# Patient Record
Sex: Male | Born: 1990 | Race: Black or African American | Hispanic: No | Marital: Single | Smoking: Current every day smoker
Health system: Southern US, Community
[De-identification: ages and names within clinical notes are randomized; demographics above are authoritative.]

## PROBLEM LIST (undated history)

## (undated) DIAGNOSIS — I10 Essential (primary) hypertension: Secondary | ICD-10-CM

---

## 2020-02-20 ENCOUNTER — Inpatient Hospital Stay (HOSPITAL_COMMUNITY)
Admission: EM | Admit: 2020-02-20 | Discharge: 2020-02-25 | DRG: 558 | Disposition: A | Discharge status: Home / Self Care | Attending: Internal Medicine | Admitting: Internal Medicine

## 2020-02-20 ENCOUNTER — Other Ambulatory Visit: Payer: Self-pay

## 2020-02-20 ENCOUNTER — Emergency Department (HOSPITAL_COMMUNITY)

## 2020-02-20 ENCOUNTER — Encounter (HOSPITAL_COMMUNITY): Payer: Self-pay | Admitting: Emergency Medicine

## 2020-02-20 DIAGNOSIS — Z79899 Other long term (current) drug therapy: Secondary | ICD-10-CM

## 2020-02-20 DIAGNOSIS — E669 Obesity, unspecified: Secondary | ICD-10-CM | POA: Diagnosis present

## 2020-02-20 DIAGNOSIS — R569 Unspecified convulsions: Secondary | ICD-10-CM | POA: Diagnosis present

## 2020-02-20 DIAGNOSIS — Z87820 Personal history of traumatic brain injury: Secondary | ICD-10-CM | POA: Diagnosis not present

## 2020-02-20 DIAGNOSIS — Z20822 Contact with and (suspected) exposure to covid-19: Secondary | ICD-10-CM | POA: Diagnosis not present

## 2020-02-20 DIAGNOSIS — E860 Dehydration: Secondary | ICD-10-CM | POA: Diagnosis not present

## 2020-02-20 DIAGNOSIS — R55 Syncope and collapse: Secondary | ICD-10-CM | POA: Diagnosis present

## 2020-02-20 DIAGNOSIS — M62838 Other muscle spasm: Secondary | ICD-10-CM | POA: Diagnosis present

## 2020-02-20 DIAGNOSIS — R21 Rash and other nonspecific skin eruption: Secondary | ICD-10-CM | POA: Diagnosis present

## 2020-02-20 DIAGNOSIS — M6282 Rhabdomyolysis: Principal | ICD-10-CM | POA: Diagnosis present

## 2020-02-20 DIAGNOSIS — N179 Acute kidney failure, unspecified: Secondary | ICD-10-CM | POA: Diagnosis not present

## 2020-02-20 DIAGNOSIS — F17200 Nicotine dependence, unspecified, uncomplicated: Secondary | ICD-10-CM | POA: Diagnosis not present

## 2020-02-20 DIAGNOSIS — I10 Essential (primary) hypertension: Secondary | ICD-10-CM | POA: Diagnosis present

## 2020-02-20 DIAGNOSIS — Z832 Family history of diseases of the blood and blood-forming organs and certain disorders involving the immune mechanism: Secondary | ICD-10-CM

## 2020-02-20 DIAGNOSIS — Z833 Family history of diabetes mellitus: Secondary | ICD-10-CM

## 2020-02-20 DIAGNOSIS — Z6834 Body mass index (BMI) 34.0-34.9, adult: Secondary | ICD-10-CM | POA: Diagnosis not present

## 2020-02-20 DIAGNOSIS — E871 Hypo-osmolality and hyponatremia: Secondary | ICD-10-CM | POA: Diagnosis present

## 2020-02-20 HISTORY — DX: Essential (primary) hypertension: I10

## 2020-02-20 LAB — CBC WITH DIFFERENTIAL/PLATELET
Abs Immature Granulocytes: 0.07 10*3/uL (ref 0.00–0.07)
Basophils Absolute: 0.1 10*3/uL (ref 0.0–0.1)
Basophils Relative: 1 %
Eosinophils Absolute: 0.1 10*3/uL (ref 0.0–0.5)
Eosinophils Relative: 0 %
HCT: 48.7 % (ref 39.0–52.0)
Hemoglobin: 16.7 g/dL (ref 13.0–17.0)
Immature Granulocytes: 1 %
Lymphocytes Relative: 13 %
Lymphs Abs: 1.7 10*3/uL (ref 0.7–4.0)
MCH: 29.5 pg (ref 26.0–34.0)
MCHC: 34.3 g/dL (ref 30.0–36.0)
MCV: 85.9 fL (ref 80.0–100.0)
Monocytes Absolute: 1 10*3/uL (ref 0.1–1.0)
Monocytes Relative: 8 %
Neutro Abs: 9.8 10*3/uL — ABNORMAL HIGH (ref 1.7–7.7)
Neutrophils Relative %: 77 %
Platelets: 387 10*3/uL (ref 150–400)
RBC: 5.67 MIL/uL (ref 4.22–5.81)
RDW: 11.9 % (ref 11.5–15.5)
WBC: 12.7 10*3/uL — ABNORMAL HIGH (ref 4.0–10.5)
nRBC: 0 % (ref 0.0–0.2)

## 2020-02-20 LAB — URINALYSIS, ROUTINE W REFLEX MICROSCOPIC
Bacteria, UA: NONE SEEN
Bilirubin Urine: NEGATIVE
Glucose, UA: NEGATIVE mg/dL
Ketones, ur: NEGATIVE mg/dL
Leukocytes,Ua: NEGATIVE
Nitrite: NEGATIVE
Protein, ur: NEGATIVE mg/dL
Specific Gravity, Urine: 1.008 (ref 1.005–1.030)
pH: 6 (ref 5.0–8.0)

## 2020-02-20 LAB — COMPREHENSIVE METABOLIC PANEL
ALT: 51 U/L — ABNORMAL HIGH (ref 0–44)
AST: 86 U/L — ABNORMAL HIGH (ref 15–41)
Albumin: 4.6 g/dL (ref 3.5–5.0)
Alkaline Phosphatase: 57 U/L (ref 38–126)
Anion gap: 11 (ref 5–15)
BUN: 18 mg/dL (ref 6–20)
CO2: 22 mmol/L (ref 22–32)
Calcium: 9.8 mg/dL (ref 8.9–10.3)
Chloride: 99 mmol/L (ref 98–111)
Creatinine, Ser: 1.69 mg/dL — ABNORMAL HIGH (ref 0.61–1.24)
GFR calc Af Amer: >60 mL/min (ref >60)
GFR calc non Af Amer: 54 mL/min — ABNORMAL LOW (ref >60)
Glucose, Bld: 112 mg/dL — ABNORMAL HIGH (ref 70–99)
Potassium: 4.9 mmol/L (ref 3.5–5.1)
Sodium: 132 mmol/L — ABNORMAL LOW (ref 135–145)
Total Bilirubin: 0.9 mg/dL (ref 0.3–1.2)
Total Protein: 8.2 g/dL — ABNORMAL HIGH (ref 6.5–8.1)

## 2020-02-20 LAB — MAGNESIUM: Magnesium: 2 mg/dL (ref 1.7–2.4)

## 2020-02-20 LAB — RAPID URINE DRUG SCREEN, HOSP PERFORMED
Amphetamines: NOT DETECTED
Barbiturates: NOT DETECTED
Benzodiazepines: NOT DETECTED
Cocaine: NOT DETECTED
Opiates: NOT DETECTED
Tetrahydrocannabinol: POSITIVE — AB

## 2020-02-20 LAB — PHOSPHORUS: Phosphorus: 2.9 mg/dL (ref 2.5–4.6)

## 2020-02-20 LAB — CK: Total CK: 5083 U/L — ABNORMAL HIGH (ref 49–397)

## 2020-02-20 MED ORDER — LACTATED RINGERS IV BOLUS
1000.0000 mL | Freq: Once | INTRAVENOUS | Status: AC
  Administered 2020-02-20: 1000 mL via INTRAVENOUS

## 2020-02-20 MED ORDER — LACTATED RINGERS IV SOLN: INTRAVENOUS | Status: DC
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
## 2020-02-21 ENCOUNTER — Encounter (HOSPITAL_COMMUNITY): Payer: Self-pay | Admitting: Internal Medicine

## 2020-02-21 ENCOUNTER — Observation Stay (HOSPITAL_COMMUNITY)

## 2020-02-21 ENCOUNTER — Inpatient Hospital Stay (HOSPITAL_COMMUNITY)

## 2020-02-21 DIAGNOSIS — M6282 Rhabdomyolysis: Principal | ICD-10-CM

## 2020-02-21 DIAGNOSIS — M62838 Other muscle spasm: Secondary | ICD-10-CM | POA: Diagnosis present

## 2020-02-21 DIAGNOSIS — N179 Acute kidney failure, unspecified: Secondary | ICD-10-CM | POA: Diagnosis present

## 2020-02-21 DIAGNOSIS — Z832 Family history of diseases of the blood and blood-forming organs and certain disorders involving the immune mechanism: Secondary | ICD-10-CM | POA: Diagnosis not present

## 2020-02-21 DIAGNOSIS — E871 Hypo-osmolality and hyponatremia: Secondary | ICD-10-CM

## 2020-02-21 DIAGNOSIS — R569 Unspecified convulsions: Secondary | ICD-10-CM | POA: Diagnosis present

## 2020-02-21 DIAGNOSIS — Z6834 Body mass index (BMI) 34.0-34.9, adult: Secondary | ICD-10-CM | POA: Diagnosis not present

## 2020-02-21 DIAGNOSIS — R55 Syncope and collapse: Secondary | ICD-10-CM

## 2020-02-21 DIAGNOSIS — I10 Essential (primary) hypertension: Secondary | ICD-10-CM

## 2020-02-21 DIAGNOSIS — E669 Obesity, unspecified: Secondary | ICD-10-CM | POA: Diagnosis present

## 2020-02-21 DIAGNOSIS — Z79899 Other long term (current) drug therapy: Secondary | ICD-10-CM | POA: Diagnosis not present

## 2020-02-21 DIAGNOSIS — Z87820 Personal history of traumatic brain injury: Secondary | ICD-10-CM | POA: Diagnosis not present

## 2020-02-21 DIAGNOSIS — E860 Dehydration: Secondary | ICD-10-CM | POA: Diagnosis present

## 2020-02-21 DIAGNOSIS — Z20822 Contact with and (suspected) exposure to covid-19: Secondary | ICD-10-CM | POA: Diagnosis present

## 2020-02-21 DIAGNOSIS — F17200 Nicotine dependence, unspecified, uncomplicated: Secondary | ICD-10-CM | POA: Diagnosis present

## 2020-02-21 DIAGNOSIS — Z833 Family history of diabetes mellitus: Secondary | ICD-10-CM | POA: Diagnosis not present

## 2020-02-21 DIAGNOSIS — R21 Rash and other nonspecific skin eruption: Secondary | ICD-10-CM | POA: Diagnosis present

## 2020-02-21 LAB — CBC WITH DIFFERENTIAL/PLATELET
Abs Immature Granulocytes: 0.03 10*3/uL (ref 0.00–0.07)
Basophils Absolute: 0 10*3/uL (ref 0.0–0.1)
Basophils Relative: 1 %
Eosinophils Absolute: 0.2 10*3/uL (ref 0.0–0.5)
Eosinophils Relative: 2 %
HCT: 41.7 % (ref 39.0–52.0)
Hemoglobin: 14 g/dL (ref 13.0–17.0)
Immature Granulocytes: 0 %
Lymphocytes Relative: 22 %
Lymphs Abs: 1.9 10*3/uL (ref 0.7–4.0)
MCH: 29.4 pg (ref 26.0–34.0)
MCHC: 33.6 g/dL (ref 30.0–36.0)
MCV: 87.4 fL (ref 80.0–100.0)
Monocytes Absolute: 0.9 10*3/uL (ref 0.1–1.0)
Monocytes Relative: 10 %
Neutro Abs: 5.7 10*3/uL (ref 1.7–7.7)
Neutrophils Relative %: 65 %
Platelets: 299 10*3/uL (ref 150–400)
RBC: 4.77 MIL/uL (ref 4.22–5.81)
RDW: 12 % (ref 11.5–15.5)
WBC: 8.7 10*3/uL (ref 4.0–10.5)
nRBC: 0 % (ref 0.0–0.2)

## 2020-02-21 LAB — COMPREHENSIVE METABOLIC PANEL
ALT: 45 U/L — ABNORMAL HIGH (ref 0–44)
AST: 84 U/L — ABNORMAL HIGH (ref 15–41)
Albumin: 3.5 g/dL (ref 3.5–5.0)
Alkaline Phosphatase: 44 U/L (ref 38–126)
Anion gap: 8 (ref 5–15)
BUN: 15 mg/dL (ref 6–20)
CO2: 24 mmol/L (ref 22–32)
Calcium: 8.8 mg/dL — ABNORMAL LOW (ref 8.9–10.3)
Chloride: 105 mmol/L (ref 98–111)
Creatinine, Ser: 1.21 mg/dL (ref 0.61–1.24)
GFR calc Af Amer: >60 mL/min (ref >60)
GFR calc non Af Amer: >60 mL/min (ref >60)
Glucose, Bld: 119 mg/dL — ABNORMAL HIGH (ref 70–99)
Potassium: 4 mmol/L (ref 3.5–5.1)
Sodium: 137 mmol/L (ref 135–145)
Total Bilirubin: 0.6 mg/dL (ref 0.3–1.2)
Total Protein: 6.4 g/dL — ABNORMAL LOW (ref 6.5–8.1)

## 2020-02-21 LAB — HEMOGLOBIN A1C
Hgb A1c MFr Bld: 5.8 % — ABNORMAL HIGH (ref 4.8–5.6)
Mean Plasma Glucose: 119.76 mg/dL

## 2020-02-21 LAB — TSH: TSH: 2.143 u[IU]/mL (ref 0.350–4.500)

## 2020-02-21 LAB — MAGNESIUM: Magnesium: 2 mg/dL (ref 1.7–2.4)

## 2020-02-21 LAB — TROPONIN I (HIGH SENSITIVITY)
Troponin I (High Sensitivity): 47 ng/L — ABNORMAL HIGH (ref <18)
Troponin I (High Sensitivity): 36 ng/L — ABNORMAL HIGH (ref <18)

## 2020-02-21 LAB — SARS CORONAVIRUS 2 BY RT PCR (DIASORIN): SARS Coronavirus 2: NEGATIVE

## 2020-02-21 LAB — CK: Total CK: 5225 U/L — ABNORMAL HIGH (ref 49–397)

## 2020-02-21 MED ORDER — SODIUM CHLORIDE 0.9 % IV SOLN: INTRAVENOUS | Status: DC

## 2020-02-21 MED ORDER — LORAZEPAM 2 MG/ML IJ SOLN
1.0000 mg | Freq: Once | INTRAMUSCULAR | Status: AC
  Administered 2020-02-21: 1 mg via INTRAVENOUS
  Filled 2020-02-21: qty 1

## 2020-02-21 MED ORDER — AMLODIPINE BESYLATE 10 MG PO TABS
10.0000 mg | ORAL_TABLET | Freq: Every day | ORAL | Status: DC
  Administered 2020-02-21 – 2020-02-25 (×5): 10 mg via ORAL
  Filled 2020-02-21 (×2): qty 1
  Filled 2020-02-21: qty 2
  Filled 2020-02-21 (×2): qty 1

## 2020-02-21 MED ORDER — ENOXAPARIN SODIUM 40 MG/0.4ML ~~LOC~~ SOLN
40.0000 mg | SUBCUTANEOUS | Status: DC
  Administered 2020-02-21 – 2020-02-25 (×5): 40 mg via SUBCUTANEOUS
  Filled 2020-02-21 (×7): qty 0.4

## 2020-02-21 MED ORDER — ONDANSETRON HCL 4 MG PO TABS
4.0000 mg | ORAL_TABLET | Freq: Four times a day (QID) | ORAL | Status: DC | PRN
  Administered 2020-02-22: 4 mg via ORAL
  Filled 2020-02-21: qty 1

## 2020-02-21 MED ORDER — MONTELUKAST SODIUM 10 MG PO TABS
10.0000 mg | ORAL_TABLET | Freq: Every day | ORAL | Status: DC
  Administered 2020-02-21 – 2020-02-24 (×4): 10 mg via ORAL
  Filled 2020-02-21 (×5): qty 1

## 2020-02-21 MED ORDER — ONDANSETRON HCL 4 MG/2ML IJ SOLN: 4.0000 mg | Freq: Four times a day (QID) | INTRAMUSCULAR | Status: DC | PRN

## 2020-02-21 NOTE — Procedures (Signed)
Patient Name: Casey Lyons  MRN: [id number]  Epilepsy Attending: Charlsie Quest  Referring Physician/Provider: Dr Midge Minium Date: 02/21/2020 Duration: 25.40 mins  Patient history: 29 year old male with an episode of altered mental status, right arm stiffness, eye blinking that sounds most consistent with a complex partial seizure. EEG to evaluate for seizure.   Level of alertness: Awake, asleep  AEDs during EEG study: None  Technical aspects: This EEG study was done with scalp electrodes positioned according to the 10-20 International system of electrode placement. Electrical activity was acquired at a sampling rate of 500Hz  and reviewed with a high frequency filter of 70Hz  and a low frequency filter of 1Hz . EEG data were recorded continuously and digitally stored.   Description: The posterior dominant rhythm consists of 10.5 Hz activity of moderate voltage (25-35 uV) seen predominantly in posterior head regions, symmetric and reactive to eye opening and eye closing. Sleep was characterized by vertex waves, sleep spindles (12 to 14 Hz), maximal frontocentral region.  Physiology photic driving was not seen during photic stimulation.  Hyperventilation was not performed.     IMPRESSION: This study is within normal limits. No seizures or epileptiform discharges were seen throughout the recording.  Danel Studzinski 
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
## 2020-02-21 NOTE — Assessment & Plan Note (Addendum)
-  Etiology at this time includes possible dehydration/physical overexertion versus due to possible seizure (EEG and MRI brain normal) -Per neurology, patient started on Keppra.  Continue on 500 mg twice daily.  Outpatient follow-up with neuromuscular -Continue on telemetry monitoring

## 2020-02-21 NOTE — Progress Notes (Signed)
PROGRESS NOTE    Casey Lyons   HQI:[id number]  DOB: 10/24/1990  DOA: 02/20/2020     0  PCP[facility]  CC: loss of consciousness/muscle cramping/spasms  Hospital Course: [name] is a 29 yo AA male with PMH HTN, muscle spasms who was admitted to the hospital after an episode of severe muscle spasm/cramping.  He described an episode where his hands and legs had "locked up" and were almost shaking.  There was concern for possible seizure-like activity although there was no described tongue biting or urinary incontinence.  His limbs were noted to be rigid at time of EMS arrival. 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  He endorses staying hydrated during the day, notably drinking up to a gallon of water but also tries to intake electrolyte solutions as well.  He endorses to similar prior episodes in life as well.  He does take baclofen as needed for his muscle spasms. On work-up he was found to have rhabdomyolysis with CK approximately 5000.  He also underwent CT head and MRI brain to rule out stroke and neurology was consulted for EEG.   Interval History:  Admitted overnight after questionable seizure episode versus severe muscle spasms/cramping.  He is still sore in his arms and legs as well as stomach this morning.  Wife is bedside and also updated with questions answered.  He endorses being hydrated at work mostly drinking water but does do electrolyte solutions intermittently.  He states that he typically has multiple episodes daily of significant sweating which he says is normal for him.  Old records reviewed in assessment of this patient  ROS: Pertinent items noted in HPI and remainder of comprehensive ROS otherwise negative.  Assessment & Plan: Rhabdomyolysis - CK ~ 5000 on admission; differential for etiology includes dehydration leading to rhabdomyolysis versus seizure precipitating rhabdo -Continue fluids and trend CK.  Patient is  appropriately sore as expected -Agree that indeterminate troponin likely related to rhabdo.  Continue trending.  Patient has no chest pain or shortness of breath.  Some vague ST changes appreciated on EKG.  Repeat after rhabdo improves further -Monitor renal function.  Creatinine was 1.69 on admission -Repeat BMP daily  Essential hypertension -Continue amlodipine  Syncope -Etiology at this time includes possible dehydration/physical overexertion versus due to possible seizure -Continue on telemetry monitoring -Follow-up EEG -Follow-up MRI brain.  Initial CT head unremarkable  Hyponatremia -Mild and not severe enough to contribute to a seizure however does pose concern for possible undetected hyponatremia at other times.  Etiology would either be due to excessive water intake versus volume depletion from dehydration/sweating -Monitor sodium while on fluids   Antimicrobials: N/A  DVT prophylaxis: Lovenox Code Status: Full Family Communication: Wife bedside Disposition Plan:  Status is: Observation  The patient will require care spanning > 2 midnights and should be moved to inpatient because: Persistent severe electrolyte disturbances, IV treatments appropriate due to intensity of illness or inability to take PO and Inpatient level of care appropriate due to severity of illness  Dispo: The patient is from: Home              Anticipated d/c is to: Home              Anticipated d/c date is: 2 days              Patient currently is not medically stable to d/c.   Objective: Blood pressure (!) 115/88, pulse 77, temperature 98.5  F (36.9 C), temperature source Oral, resp. rate 22, height 5\' 10"  (1.778 m), weight (!) 102.1 kg, SpO2 97 %.  Examination: General appearance: alert, cooperative and no distress Head: Normocephalic, without obvious abnormality, atraumatic Eyes: EOMI Lungs: clear to auscultation bilaterally Heart: regular rate and rhythm and S1, S2 normal Abdomen: Tender  to palpation nonspecifically, bowel sounds present, nondistended Extremities: No edema Skin: mobility and turgor normal Neurologic: Grossly normal   Consultants:   Neurology  Procedures:   EEG, 02/21/2020  Data Reviewed: I have personally reviewed following labs and imaging studies Results for orders placed or performed during the hospital encounter of 02/20/20 (from the past 24 hour(s))  CBC WITH DIFFERENTIAL     Status: Abnormal   Collection Time: 02/20/20  9:11 PM  Result Value Ref Range   WBC 12.7 (H) 4.0 - 10.5 K/uL   RBC 5.67 4.22 - 5.81 MIL/uL   Hemoglobin 16.7 13.0 - 17.0 g/dL   HCT 02/22/20 39 - 52 %   MCV 85.9 80.0 - 100.0 fL   MCH 29.5 26.0 - 34.0 pg   MCHC 34.3 30.0 - 36.0 g/dL   RDW 28.7 68.1 - 15.7 %   Platelets 387 150 - 400 K/uL   nRBC 0.0 0.0 - 0.2 %   Neutrophils Relative % 77 %   Neutro Abs 9.8 (H) 1.7 - 7.7 K/uL   Lymphocytes Relative 13 %   Lymphs Abs 1.7 0.7 - 4.0 K/uL   Monocytes Relative 8 %   Monocytes Absolute 1.0 0 - 1 K/uL   Eosinophils Relative 0 %   Eosinophils Absolute 0.1 0 - 0 K/uL   Basophils Relative 1 %   Basophils Absolute 0.1 0 - 0 K/uL   Immature Granulocytes 1 %   Abs Immature Granulocytes 0.07 0.00 - 0.07 K/uL  Comprehensive metabolic panel     Status: Abnormal   Collection Time: 02/20/20  9:11 PM  Result Value Ref Range   Sodium 132 (L) 135 - 145 mmol/L   Potassium 4.9 3.5 - 5.1 mmol/L   Chloride 99 98 - 111 mmol/L   CO2 22 22 - 32 mmol/L   Glucose, Bld 112 (H) 70 - 99 mg/dL   BUN 18 6 - 20 mg/dL   Creatinine, Ser 02/22/20 (H) 0.61 - 1.24 mg/dL   Calcium 9.8 8.9 - 0.35 mg/dL   Total Protein 8.2 (H) 6.5 - 8.1 g/dL   Albumin 4.6 3.5 - 5.0 g/dL   AST 86 (H) 15 - 41 U/L   ALT 51 (H) 0 - 44 U/L   Alkaline Phosphatase 57 38 - 126 U/L   Total Bilirubin 0.9 0.3 - 1.2 mg/dL   GFR calc non Af Amer 54 (L) >60 mL/min   GFR calc Af Amer >60 >60 mL/min   Anion gap 11 5 - 15  CK     Status: Abnormal   Collection Time: 02/20/20  9:11 PM    Result Value Ref Range   Total CK 5,083 (H) 49.0 - 397.0 U/L  Magnesium     Status: None   Collection Time: 02/20/20  9:32 PM  Result Value Ref Range   Magnesium 2.0 1.7 - 2.4 mg/dL  Phosphorus     Status: None   Collection Time: 02/20/20  9:32 PM  Result Value Ref Range   Phosphorus 2.9 2.5 - 4.6 mg/dL  Rapid urine drug screen (hospital performed)     Status: Abnormal   Collection Time: 02/20/20  9:43 PM  Result Value  Ref Range   Opiates NONE DETECTED NONE DETECTED   Cocaine NONE DETECTED NONE DETECTED   Benzodiazepines NONE DETECTED NONE DETECTED   Amphetamines NONE DETECTED NONE DETECTED   Tetrahydrocannabinol POSITIVE (A) NONE DETECTED   Barbiturates NONE DETECTED NONE DETECTED  Urinalysis, Routine w reflex microscopic     Status: Abnormal   Collection Time: 02/20/20  9:43 PM  Result Value Ref Range   Color, Urine YELLOW YELLOW   APPearance HAZY (A) CLEAR   Specific Gravity, Urine 1.008 1.005 - 1.030   pH 6.0 5.0 - 8.0   Glucose, UA NEGATIVE NEGATIVE mg/dL   Hgb urine dipstick SMALL (A) NEGATIVE   Bilirubin Urine NEGATIVE NEGATIVE   Ketones, ur NEGATIVE NEGATIVE mg/dL   Protein, ur NEGATIVE NEGATIVE mg/dL   Nitrite NEGATIVE NEGATIVE   Leukocytes,Ua NEGATIVE NEGATIVE   RBC / HPF 0-5 0 - 5 RBC/hpf   WBC, UA 0-5 0 - 5 WBC/hpf   Bacteria, UA NONE SEEN NONE SEEN   Squamous Epithelial / LPF 0-5 0 - 5   Mucus PRESENT    Hyaline Casts, UA PRESENT   SARS Coronavirus 2 by RT PCR     Status: None   Collection Time: 02/21/20 12:41 AM  Result Value Ref Range   SARS Coronavirus 2 NEGATIVE NEGATIVE  TSH     Status: None   Collection Time: 02/21/20  2:07 AM  Result Value Ref Range   TSH 2.143 0.350 - 4.500 uIU/mL  Troponin I (High Sensitivity)     Status: Abnormal   Collection Time: 02/21/20  2:07 AM  Result Value Ref Range   Troponin I (High Sensitivity) 47 (H) <18 ng/L  CBC with Differential/Platelet     Status: None   Collection Time: 02/21/20  8:34 AM  Result Value Ref  Range   WBC 8.7 4.0 - 10.5 K/uL   RBC 4.77 4.22 - 5.81 MIL/uL   Hemoglobin 14.0 13.0 - 17.0 g/dL   HCT 35.3 39 - 52 %   MCV 87.4 80.0 - 100.0 fL   MCH 29.4 26.0 - 34.0 pg   MCHC 33.6 30.0 - 36.0 g/dL   RDW 61.4 43.1 - 54.0 %   Platelets 299 150 - 400 K/uL   nRBC 0.0 0.0 - 0.2 %   Neutrophils Relative % 65 %   Neutro Abs 5.7 1.7 - 7.7 K/uL   Lymphocytes Relative 22 %   Lymphs Abs 1.9 0.7 - 4.0 K/uL   Monocytes Relative 10 %   Monocytes Absolute 0.9 0 - 1 K/uL   Eosinophils Relative 2 %   Eosinophils Absolute 0.2 0 - 0 K/uL   Basophils Relative 1 %   Basophils Absolute 0.0 0 - 0 K/uL   Immature Granulocytes 0 %   Abs Immature Granulocytes 0.03 0.00 - 0.07 K/uL    Recent Results (from the past 240 hour(s))  SARS Coronavirus 2 by RT PCR     Status: None   Collection Time: 02/21/20 12:41 AM  Result Value Ref Range Status   SARS Coronavirus 2 NEGATIVE NEGATIVE Final    Comment: (NOTE) SARS-CoV-2 target nucleic acids are NOT DETECTED.  The SARS-CoV-2 RNA is generally detectable in upper and lower respiratory specimens during the acute phase of infection. The lowest concentration of SARS-CoV-2 viral copies this assay can detect is 250 copies / mL. A negative result does not preclude SARS-CoV-2 infection and should not be used as the sole basis for treatment or other patient management decisions.  A negative  result may occur with improper specimen collection / handling, submission of specimen other than nasopharyngeal swab, presence of viral mutation(s) within the areas targeted by this assay, and inadequate number of viral copies (<250 copies / mL). A negative result must be combined with clinical observations, patient history, and epidemiological information.  Fact Sheet for Patients:   BoilerBrush.com.cyhttps://www.fda.gov/media/136312/download  Fact Sheet for Healthcare Providers: https://pope.com/https://www.fda.gov/media/136313/download  This test is not yet approved or  cleared by the [place] FDA  and has been authorized for detection and/or diagnosis of SARS-CoV-2 by FDA under an Emergency Use Authorization (EUA).  This EUA will remain in effect (meaning this test can be used) for the duration of the COVID-19 declaration under Section 564(b)(1) of the Act, 21 U.S.C. section 360bbb-3(b)(1), unless the authorization is terminated or revoked sooner.  Performed at [facility], [address] [place], [place] [address]      Radiology Studies: CT HEAD WO CONTRAST  Result Date: 02/20/2020 CLINICAL DATA:  Seizure EXAM: CT HEAD WITHOUT CONTRAST TECHNIQUE: Contiguous axial images were obtained from the base of the skull through the vertex without intravenous contrast. COMPARISON:  None. FINDINGS: Brain: No evidence of acute infarction, hemorrhage, hydrocephalus, extra-axial collection or mass lesion/mass effect. Vascular: No hyperdense vessel or unexpected calcification. Skull: Normal. Negative for fracture or focal lesion. Sinuses/Orbits: Mucous retention cysts in the maxillary sinuses Other: None IMPRESSION: Negative non contrasted CT appearance of the brain. Electronically Signed   By: Jasmine PangKim  Fujinaga M.D.   On: 02/20/2020 22:22   MR BRAIN WO CONTRAST  Result Date: 02/21/2020 CLINICAL DATA:  Mental status change, unknown cause. Additional history provided: Loss of consciousness. EXAM: MRI HEAD WITHOUT CONTRAST TECHNIQUE: Multiplanar, multiecho pulse sequences of the brain and surrounding structures were obtained without intravenous contrast. COMPARISON:  Head CT 02/20/2020 FINDINGS: Brain: The examination is intermittently motion degraded, limiting evaluation. Most notably, there is mild-to-moderate motion degradation of the sagittal T1 weighted sequence, mild-to-moderate motion degradation of the axial T2 weighted sequence, moderate motion degradation of the axial SWI sequence. There are a few scattered punctate foci of T2/FLAIR hyperintensity within the cerebral white matter which are  nonspecific. There is no acute infarct. No evidence of intracranial mass. No chronic intracranial blood products. No extra-axial fluid collection. No midline shift. Vascular: Expected proximal arterial flow voids. Skull and upper cervical spine: No focal marrow lesion. Sinuses/Orbits: Visualized orbits show no acute finding. Mild ethmoid sinus mucosal thickening. Small bilateral maxillary sinus mucous retention cyst. No significant mastoid effusion. IMPRESSION: Motion degraded examination as described. No evidence of acute infarct or other acute intracranial abnormality. There are a few scattered nonspecific punctate foci of T2/FLAIR hyperintensity within the cerebral white matter. Mild ethmoid sinus mucosal thickening. Small bilateral maxillary sinus mucous retention cysts. Electronically Signed   By: Jackey LogeKyle  Golden DO   On: 02/21/2020 08:36   MR BRAIN WO CONTRAST  Final Result    CT HEAD WO CONTRAST  Final Result       Scheduled Meds: . amLODipine  10 mg Oral Daily  . enoxaparin (LOVENOX) injection  40 mg Subcutaneous Q24H  . montelukast  10 mg Oral QHS   PRN Meds: ondansetron **OR** ondansetron (ZOFRAN) IV Continuous Infusions: . sodium chloride 125 mL/hr at 02/21/20 0438  . lactated ringers Stopped (02/21/20 0222)      LOS: 0 days  Time spent: Greater than 50% of the 35 minute visit was spent in counseling/coordination of care for the patient as laid out in the A&P.  Lewie Chamber, MD [facility] 02/21/2020, 9:07 AM   Contact via secure chat.  To contact the attending provider between 7A-7P or the covering provider during after hours 7P-7A, please log into the web site [email or web address] and access using universal [place] password for that web site. If you do not have the password, please call the hospital operator.

## 2020-02-21 NOTE — Assessment & Plan Note (Addendum)
-  Mild and not severe enough to contribute to a seizure however does pose concern for possible undetected hyponatremia at other times.  Etiology would either be due to excessive water intake versus volume depletion from dehydration/sweating -Monitor sodium while on fluids.  Has responded well to fluids and he was likely volume depleted/dehydrated despite his water intake that he states

## 2020-02-21 NOTE — ED Notes (Signed)
EEG at bedside.

## 2020-02-21 NOTE — ED Notes (Signed)
Pt ambulatory to and from restroom with steady gait 

## 2020-02-21 NOTE — Assessment & Plan Note (Addendum)
-   CK ~ 5000 on admission; differential for etiology includes dehydration leading to rhabdomyolysis versus seizure precipitating rhabdo.  Seizure has now been ruled out as a feasible cause.  After further work-up and history taking, there may also be other underlying contributing etiologies.  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  He has described episodes in the past of vague skin manifestations/rashes of unknown origin or trigger.  He also endorses muscle soreness that is out of proportion to the activity being done along with his physical exertion while outside.  There is also a family history of autoimmune disease -CK was initially unresponsive to fluids which also raised suspicion of underlying myositis.  LDH is also elevated as well as LFTs which are also commonly seen with myositis. Workup started; follow up anti-Ro, anti-La, and anti-Smith antibodies.  Will also check ANA.  He will need outpatient referral to rheumatology thru the [place] most likely.  -Continue trending CK, responding better now with gradual downtrend; likely can d/c home on Tues or Wed -Troponin has normalized with fluids
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
## 2020-02-21 NOTE — Progress Notes (Signed)
EEG complete - results pending 

## 2020-02-21 NOTE — Assessment & Plan Note (Signed)
-   Continue amlodipine ?

## 2020-02-21 NOTE — ED Notes (Signed)
Breakfast Ordered 

## 2020-02-21 NOTE — ED Notes (Signed)
Patient transported to MRI 

## 2020-02-21 NOTE — Hospital Course (Addendum)
Casey Lyons is a 29 yo AA male with PMH HTN, muscle spasms who was admitted to the hospital after an episode of severe muscle spasm/cramping.  He described an episode where his hands and legs had "locked up" and were almost shaking.  There was concern for possible seizure-like activity although there was no described tongue biting or urinary incontinence.  His limbs were noted to be rigid at time of EMS arrival. 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  He endorses staying hydrated during the day, notably drinking up to a gallon of water but also tries to intake electrolyte solutions as well.  He endorses to similar prior episodes in life as well.  He does take baclofen as needed for his muscle spasms. On work-up he was found to have rhabdomyolysis with CK approximately 5000.  He also underwent CT head and MRI brain to rule out stroke and neurology was consulted for EEG. EEG was negative for sz activity and MRI brain was negative for obvious acute abnormalities.  There were a few nonspecific scattered punctate foci in the cerebral right matter of unclear significance but likely not clinically relevant. He was started on Keppra per neurology due to high suspicion that patient had a seizure prior to presentation.  Patient was also instructed to refrain from driving per neurology.  He will follow-up outpatient with neuromuscular (Dr. Allena Katz) and referral was placed during hospitalization from neurology.  With further work-up and obtaining further history from patient, his CK did not significantly respond to IV fluids.  He was also found to have elevated liver enzymes, elevated LDH, and he also started endorsing history of a vague rash on his skin that he experienced intermittently.  He also then endorsed a further family history of lupus.  He will certainly need referral to rheumatology at follow-up after discharge.  His CK overall continued to downtrend with monitoring.  His muscle  soreness persisted but improved daily. He will need follow up with neurology at discharge regarding starting on Keppra during hospitalization and referral to rheumatology for consideration of further myositis workup. Labs pending at time of discharge.

## 2020-02-22 DIAGNOSIS — N179 Acute kidney failure, unspecified: Secondary | ICD-10-CM

## 2020-02-22 LAB — CBC WITH DIFFERENTIAL/PLATELET
Abs Immature Granulocytes: 0.01 10*3/uL (ref 0.00–0.07)
Basophils Absolute: 0 10*3/uL (ref 0.0–0.1)
Basophils Relative: 1 %
Eosinophils Absolute: 0.3 10*3/uL (ref 0.0–0.5)
Eosinophils Relative: 6 %
HCT: 41.8 % (ref 39.0–52.0)
Hemoglobin: 13.6 g/dL (ref 13.0–17.0)
Immature Granulocytes: 0 %
Lymphocytes Relative: 38 %
Lymphs Abs: 2 10*3/uL (ref 0.7–4.0)
MCH: 28.9 pg (ref 26.0–34.0)
MCHC: 32.5 g/dL (ref 30.0–36.0)
MCV: 88.7 fL (ref 80.0–100.0)
Monocytes Absolute: 0.6 10*3/uL (ref 0.1–1.0)
Monocytes Relative: 11 %
Neutro Abs: 2.3 10*3/uL (ref 1.7–7.7)
Neutrophils Relative %: 44 %
Platelets: 272 10*3/uL (ref 150–400)
RBC: 4.71 MIL/uL (ref 4.22–5.81)
RDW: 12.1 % (ref 11.5–15.5)
WBC: 5.2 10*3/uL (ref 4.0–10.5)
nRBC: 0 % (ref 0.0–0.2)

## 2020-02-22 LAB — BASIC METABOLIC PANEL
Anion gap: 8 (ref 5–15)
BUN: 9 mg/dL (ref 6–20)
CO2: 26 mmol/L (ref 22–32)
Calcium: 8.7 mg/dL — ABNORMAL LOW (ref 8.9–10.3)
Chloride: 106 mmol/L (ref 98–111)
Creatinine, Ser: 1.05 mg/dL (ref 0.61–1.24)
GFR calc Af Amer: >60 mL/min (ref >60)
GFR calc non Af Amer: >60 mL/min (ref >60)
Glucose, Bld: 99 mg/dL (ref 70–99)
Potassium: 4.2 mmol/L (ref 3.5–5.1)
Sodium: 140 mmol/L (ref 135–145)

## 2020-02-22 LAB — CK: Total CK: 5268 U/L — ABNORMAL HIGH (ref 49–397)

## 2020-02-22 LAB — LACTATE DEHYDROGENASE: LDH: 265 U/L — ABNORMAL HIGH (ref 98–192)

## 2020-02-22 LAB — TROPONIN I (HIGH SENSITIVITY): Troponin I (High Sensitivity): 15 ng/L (ref <18)

## 2020-02-22 LAB — MAGNESIUM: Magnesium: 2.2 mg/dL (ref 1.7–2.4)

## 2020-02-22 MED ORDER — HYDROXYZINE HCL 25 MG PO TABS
25.0000 mg | ORAL_TABLET | Freq: Three times a day (TID) | ORAL | Status: DC | PRN
  Administered 2020-02-23 – 2020-02-25 (×2): 25 mg via ORAL
  Filled 2020-02-22 (×2): qty 1

## 2020-02-22 MED ORDER — DIPHENHYDRAMINE-ZINC ACETATE 2-0.1 % EX CREA
TOPICAL_CREAM | Freq: Three times a day (TID) | CUTANEOUS | Status: DC | PRN
  Filled 2020-02-22 (×2): qty 28

## 2020-02-22 NOTE — Assessment & Plan Note (Signed)
-  Appears that this was related to probable dehydration.  Less likely to be a nephropathy from rhabdo given CK not high enough as typically expected as well as normalization overnight with fluids -Continue fluids and monitoring BMP

## 2020-02-22 NOTE — Progress Notes (Signed)
PROGRESS NOTE    Casey Lyons   MAU:[id number]  DOB: 21-Mar-1991  DOA: 02/20/2020     1  PCP: [facility]  CC: loss of consciousness/muscle cramping/spasms  Hospital Course: Mr. Casey Lyons is a 29 yo AA male with PMH HTN, muscle spasms who was admitted to the hospital after an episode of severe muscle spasm/cramping.  He described an episode where his hands and legs had "locked up" and were almost shaking.  There was concern for possible seizure-like activity although there was no described tongue biting or urinary incontinence.  His limbs were noted to be rigid at time of EMS arrival. 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  He endorses staying hydrated during the day, notably drinking up to a gallon of water but also tries to intake electrolyte solutions as well.  He endorses to similar prior episodes in life as well.  He does take baclofen as needed for his muscle spasms. On work-up he was found to have rhabdomyolysis with CK approximately 5000.  He also underwent CT head and MRI brain to rule out stroke and neurology was consulted for EEG.   Interval History:  No acute events overnight.  Still complaining of soreness in his muscles mainly his thighs and arms which he says is possibly a little worse than yesterday.  Still weak in his legs from the soreness as well.  He is also endorsing a very small raised rash on his forehead that is also itchy.  He states that he typically get these and then they resolve and sometimes leave a little scar.  Old records reviewed in assessment of this patient  ROS: Pertinent items noted in HPI and remainder of comprehensive ROS otherwise negative.  Assessment & Plan: Rhabdomyolysis - CK ~ 5000 on admission; differential for etiology includes dehydration leading to rhabdomyolysis versus seizure precipitating rhabdo.  Seizure has now been ruled out as a feasible cause.  After further work-up and history taking, there may also  be other underlying contributing etiologies.  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  He has described episodes in the past of vague skin manifestations/rashes of unknown origin or trigger.  He also endorses muscle soreness that is out of proportion to the activity being done along with his physical exertion while outside.  There is also a family history of autoimmune disease -CK has been unresponsive to fluids which also raises suspicion of underlying myositis.  LDH is also elevated as well as LFTs which are also commonly seen with myositis.  At this time will initiate some further work-up with anti-Ro, anti-La, and anti-Smith antibodies.  Will also check ANA.  He will need outpatient referral to rheumatology -Continue trending CK, if refractory to treatment, will likely need to pursue further inpatient work-up as he would not be considered safe/stable for discharging without further input -Troponin has normalized with fluids  Essential hypertension -Continue amlodipine  Syncope -Etiology at this time includes possible dehydration/physical overexertion versus due to possible seizure (EEG and MRI brain normal/negative so low likelihood) -Continue on telemetry monitoring -After work-up, it appears that his syncope was related to his dehydration and possibly a vagal component  Hyponatremia -Mild and not severe enough to contribute to a seizure however does pose concern for possible undetected hyponatremia at other times.  Etiology would either be due to excessive water intake versus volume depletion from dehydration/sweating -Monitor sodium while on fluids.  Has responded well to fluids  and he was likely volume depleted/dehydrated despite his water intake that he states  AKI (acute kidney injury) (HCC) -Appears that this was related to probable dehydration.  Less likely to be a nephropathy from rhabdo given CK not high enough as typically expected  as well as normalization overnight with fluids -Continue fluids and monitoring BMP   Antimicrobials: N/A  DVT prophylaxis: Lovenox Code Status: Full Family Communication: Wife bedside Disposition Plan:  Status is: Observation  The patient will require care spanning > 2 midnights and should be moved to inpatient because: Persistent severe electrolyte disturbances, IV treatments appropriate due to intensity of illness or inability to take PO and Inpatient level of care appropriate due to severity of illness  Dispo: The patient is from: Home              Anticipated d/c is to: Home              Anticipated d/c date is: 2 days              Patient currently is not medically stable to d/c.   Objective: Blood pressure 126/76, pulse 76, temperature 98.2 F (36.8 C), temperature source Oral, resp. rate 18, height 5\' 10"  (1.778 m), weight (!) 108 kg, SpO2 98 %.  Examination: General appearance: alert, cooperative and no distress Head: Normocephalic, without obvious abnormality, atraumatic Eyes: EOMI Lungs: clear to auscultation bilaterally Heart: regular rate and rhythm and S1, S2 normal Abdomen: Tender to palpation nonspecifically, bowel sounds present, nondistended Extremities: No edema.  Tenderness to palpation of quadricep muscles Skin: mobility and turgor normal Neurologic: Grossly normal   Consultants:   Neurology  Procedures:   EEG, 02/21/2020.  No seizure activity appreciated  Data Reviewed: I have personally reviewed following labs and imaging studies Results for orders placed or performed during the hospital encounter of 02/20/20 (from the past 24 hour(s))  Basic metabolic panel     Status: Abnormal   Collection Time: 02/22/20  7:02 AM  Result Value Ref Range   Sodium 140 135 - 145 mmol/L   Potassium 4.2 3.5 - 5.1 mmol/L   Chloride 106 98 - 111 mmol/L   CO2 26 22 - 32 mmol/L   Glucose, Bld 99 70 - 99 mg/dL   BUN 9 6 - 20 mg/dL   Creatinine, Ser 1.611.05 0.61 - 1.24  mg/dL   Calcium 8.7 (L) 8.9 - 10.3 mg/dL   GFR calc non Af Amer >60 >60 mL/min   GFR calc Af Amer >60 >60 mL/min   Anion gap 8 5 - 15  CBC with Differential/Platelet     Status: None   Collection Time: 02/22/20  7:02 AM  Result Value Ref Range   WBC 5.2 4.0 - 10.5 K/uL   RBC 4.71 4.22 - 5.81 MIL/uL   Hemoglobin 13.6 13.0 - 17.0 g/dL   HCT 09.641.8 39 - 52 %   MCV 88.7 80.0 - 100.0 fL   MCH 28.9 26.0 - 34.0 pg   MCHC 32.5 30.0 - 36.0 g/dL   RDW 04.512.1 40.911.5 - 81.115.5 %   Platelets 272 150 - 400 K/uL   nRBC 0.0 0.0 - 0.2 %   Neutrophils Relative % 44 %   Neutro Abs 2.3 1.7 - 7.7 K/uL   Lymphocytes Relative 38 %   Lymphs Abs 2.0 0.7 - 4.0 K/uL   Monocytes Relative 11 %   Monocytes Absolute 0.6 0 - 1 K/uL   Eosinophils Relative 6 %   Eosinophils Absolute  0.3 0 - 0 K/uL   Basophils Relative 1 %   Basophils Absolute 0.0 0 - 0 K/uL   Immature Granulocytes 0 %   Abs Immature Granulocytes 0.01 0.00 - 0.07 K/uL  Magnesium     Status: None   Collection Time: 02/22/20  7:02 AM  Result Value Ref Range   Magnesium 2.2 1.7 - 2.4 mg/dL  CK     Status: Abnormal   Collection Time: 02/22/20  7:02 AM  Result Value Ref Range   Total CK 5,268 (H) 49.0 - 397.0 U/L  Troponin I (High Sensitivity)     Status: None   Collection Time: 02/22/20  7:02 AM  Result Value Ref Range   Troponin I (High Sensitivity) 15 <18 ng/L  Lactate dehydrogenase     Status: Abnormal   Collection Time: 02/22/20  2:17 PM  Result Value Ref Range   LDH 265 (H) 98 - 192 U/L    Recent Results (from the past 240 hour(s))  SARS Coronavirus 2 by RT PCR     Status: None   Collection Time: 02/21/20 12:41 AM  Result Value Ref Range Status   SARS Coronavirus 2 NEGATIVE NEGATIVE Final    Comment: (NOTE) SARS-CoV-2 target nucleic acids are NOT DETECTED.  The SARS-CoV-2 RNA is generally detectable in upper and lower respiratory specimens during the acute phase of infection. The lowest concentration of SARS-CoV-2 viral copies this  assay can detect is 250 copies / mL. A negative result does not preclude SARS-CoV-2 infection and should not be used as the sole basis for treatment or other patient management decisions.  A negative result may occur with improper specimen collection / handling, submission of specimen other than nasopharyngeal swab, presence of viral mutation(s) within the areas targeted by this assay, and inadequate number of viral copies (<250 copies / mL). A negative result must be combined with clinical observations, patient history, and epidemiological information.  Fact Sheet for Patients:   BoilerBrush.com.cy  Fact Sheet for Healthcare Providers: https://pope.com/  This test is not yet approved or  cleared by the [place] FDA and has been authorized for detection and/or diagnosis of SARS-CoV-2 by FDA under an Emergency Use Authorization (EUA).  This EUA will remain in effect (meaning this test can be used) for the duration of the COVID-19 declaration under Section 564(b)(1) of the Act, 21 U.S.C. section 360bbb-3(b)(1), unless the authorization is terminated or revoked sooner.  Performed at [facility], [address] [place], [place] [address]      Radiology Studies: EEG  Result Date: 02/21/2020 Charlsie Quest, MD     02/21/2020 11:55 AM Patient Name: Casey Lyons MRN: [id number] Epilepsy Attending: Charlsie Quest Referring Physician/Provider: Dr Midge Minium Date: 02/21/2020 Duration: 25.40 mins Patient history: 29 year old male with an episode of altered mental status, right arm stiffness, eye blinking that sounds most consistent with a complex partial seizure. EEG to evaluate for seizure. Level of alertness: Awake, asleep AEDs during EEG study: None Technical aspects: This EEG study was done with scalp electrodes positioned according to the 10-20 International system of electrode placement. Electrical activity was acquired at a  sampling rate of 500Hz  and reviewed with a high frequency filter of 70Hz  and a low frequency filter of 1Hz . EEG data were recorded continuously and digitally stored. Description: The posterior dominant rhythm consists of 10.5 Hz activity of moderate voltage (25-35 uV) seen predominantly in posterior head regions, symmetric and reactive to eye opening and eye closing. Sleep  was characterized by vertex waves, sleep spindles (12 to 14 Hz), maximal frontocentral region.  Physiology photic driving was not seen during photic stimulation.  Hyperventilation was not performed.   IMPRESSION: This study is within normal limits. No seizures or epileptiform discharges were seen throughout the recording. Charlsie Quest   CT HEAD WO CONTRAST  Result Date: 02/20/2020 CLINICAL DATA:  Seizure EXAM: CT HEAD WITHOUT CONTRAST TECHNIQUE: Contiguous axial images were obtained from the base of the skull through the vertex without intravenous contrast. COMPARISON:  None. FINDINGS: Brain: No evidence of acute infarction, hemorrhage, hydrocephalus, extra-axial collection or mass lesion/mass effect. Vascular: No hyperdense vessel or unexpected calcification. Skull: Normal. Negative for fracture or focal lesion. Sinuses/Orbits: Mucous retention cysts in the maxillary sinuses Other: None IMPRESSION: Negative non contrasted CT appearance of the brain. Electronically Signed   By: Jasmine Pang M.D.   On: 02/20/2020 22:22   MR BRAIN WO CONTRAST  Result Date: 02/21/2020 CLINICAL DATA:  Mental status change, unknown cause. Additional history provided: Loss of consciousness. EXAM: MRI HEAD WITHOUT CONTRAST TECHNIQUE: Multiplanar, multiecho pulse sequences of the brain and surrounding structures were obtained without intravenous contrast. COMPARISON:  Head CT 02/20/2020 FINDINGS: Brain: The examination is intermittently motion degraded, limiting evaluation. Most notably, there is mild-to-moderate motion degradation of the sagittal T1 weighted  sequence, mild-to-moderate motion degradation of the axial T2 weighted sequence, moderate motion degradation of the axial SWI sequence. There are a few scattered punctate foci of T2/FLAIR hyperintensity within the cerebral white matter which are nonspecific. There is no acute infarct. No evidence of intracranial mass. No chronic intracranial blood products. No extra-axial fluid collection. No midline shift. Vascular: Expected proximal arterial flow voids. Skull and upper cervical spine: No focal marrow lesion. Sinuses/Orbits: Visualized orbits show no acute finding. Mild ethmoid sinus mucosal thickening. Small bilateral maxillary sinus mucous retention cyst. No significant mastoid effusion. IMPRESSION: Motion degraded examination as described. No evidence of acute infarct or other acute intracranial abnormality. There are a few scattered nonspecific punctate foci of T2/FLAIR hyperintensity within the cerebral white matter. Mild ethmoid sinus mucosal thickening. Small bilateral maxillary sinus mucous retention cysts. Electronically Signed   By: Jackey Loge DO   On: 02/21/2020 08:36   MR BRAIN WO CONTRAST  Final Result    CT HEAD WO CONTRAST  Final Result       Scheduled Meds:  amLODipine  10 mg Oral Daily   enoxaparin (LOVENOX) injection  40 mg Subcutaneous Q24H   montelukast  10 mg Oral QHS   PRN Meds: diphenhydrAMINE-zinc acetate, hydrOXYzine, ondansetron **OR** ondansetron (ZOFRAN) IV Continuous Infusions:  sodium chloride 150 mL/hr at 02/22/20 0410   lactated ringers Stopped (02/21/20 0222)      LOS: 1 day  Time spent: Greater than 50% of the 35 minute visit was spent in counseling/coordination of care for the patient as laid out in the A&P.   Lewie Chamber, MD [facility] 02/22/2020, 4:38 PM   Contact via secure chat.  To contact the attending provider between 7A-7P or the covering provider during after hours 7P-7A, please log into the web site [email or web address] and  access using universal [place] password for that web site. If you do not have the password, please call the hospital operator.

## 2020-02-22 NOTE — Plan of Care (Signed)

## 2020-02-22 NOTE — Progress Notes (Addendum)
HOPI: 29 year old male with an episode of altered mental status, right arm stiffness, eye blinking that sounds most consistent with a complex partial seizure.  Subjective: No more seizure like events. The semiology of the event seems concerning for seizure  Objective:  Temp:  [97.7 F (36.5 C)-99.3 F (37.4 C)] 98.2 F (36.8 C) (07/24 1154) Pulse Rate:  [58-76] 76 (07/24 1154) Resp:  [16-20] 18 (07/24 1153) BP: (105-130)/(65-91) 126/76 (07/24 1154) SpO2:  [96 %-100 %] 98 % (07/24 1154) Weight:  [259 kg] 108 kg (07/24 0333) Body mass index is 34.16 kg/m.  General: Laying comfortably in bed; in no acute distress. HENT: Normal oropharynx and mucosa. Normal external appearance of ears and nose Neck: Supple, no pain or tenderness. CV: RRR without murmur. No peripheral edema. Pulmonary: Symmetric chest rise. Normal respiratoy effort. Abdomen: positive bowel sounds, soft, non-tender. Normal liver and spleen size. Ext: No cyanosis, edema, or deformity. Skin: No rash. Normal palpation of skin. Musculoskeletal: Normal digits and nails by inspection. No Clubbing.  Neurologic Examination  MENTAL STATUS: AAOx3, memory intact, fund of knowledge appropriate LANG/SPEECH: Naming and repetition intact, fluent, follows 3-step commands CRANIAL NERVES: II: Pupils equal and reactive, no RAPD, no VF deficits, normal fundus III, IV, VI: EOM intact, no gaze preference or deviation, no nystagmus. V: normal sensation in V1, V2, and V3 segments bilaterally VII: no asymmetry, no nasolabial fold flattening VIII: normal hearing to speech IX, X: normal palatal elevation, no uvular deviation XI: 5/5 head turn and 5/5 shoulder shrug bilaterally XII: midline tongue protrusion MOTOR: 4+/4+ muscle power in BL shoulder abductors,  54+/4+ BL elbow flexors/extensors 5/5 hand grip.  4+/4+ in BL hipflexors/extensors 5/5 in BL knee flexors/extensors, ankle dorsiflexors and planter flexors.   REFLEXES: 2/4  throughout, bilateral flexor planter response, no Hoffman's, no clonus SENSORY: Normal to touch, pinprick, vibration, temp all limbs No hemineglect, no extinction to double sided stimulation (visual & tactile) Romberg absent Coordination: Normal finger to nose and heel to shin, no tremor, no dysmetria   Imaging/Labs/Diagnostics:  CK elevated to over 5000s.  MRI Brain without contrast: IMPRESSION: Motion degraded examination as described. No evidence of acute infarct or other acute intracranial abnormality. There are a few scattered nonspecific punctate foci of T2/FLAIR hyperintensity within the cerebral white matter.  rEEG: This study is within normal limits. No seizures or epileptiform discharges were seen throughout the recording.   Impression and plan: Casey Lyons is a 29 y.o. male with an episode with semiology concerning for a seizure. MRI and EEG are negative. Neuro exam with no focal deficit. Even thou the MRI and EEG are negative, would recommend starting him on an AED given high clinical suspicion for seizure.  Recs: - Keppra 500mg  BID - No driving, I informed both patient and wife - Follow up with neurology clinic outpatient - He would benefit from evaluation outpatient by Neuromuscular. I put in a referral to Dr. . - Neurology inpatient team will signoff. Please feel free to contact Nita Sickle with any questions or concerns.  [place] [facility] Pager Number Erick Blinks ______________________________________________________________________  Thank you for the opportunity to take part in the care of this patient. If you have any further questions, please contact the neurology consultation attending on call. Signed,   [phone]

## 2020-02-22 NOTE — Progress Notes (Signed)
Nutrition Brief Note  Patient identified on the Malnutrition Screening Tool (MST) Report  Wt Readings from Last 15 Encounters:  02/22/20 (!) 108 kg   Reviewed wt history in Care Everywhere. Pt weighed 106.6 kg on 07/24/2019.  Body mass index is 34.16 kg/m. Patient meets criteria for obesity based on current BMI.   Current diet order is Heart Healthy, patient is consuming approximately 100% of meals at this time. Labs and medications reviewed.   No nutrition interventions warranted at this time. If nutrition issues arise, please consult RD.   Eugene Gavia, MS, RD, LDN RD pager number and weekend/on-call pager number located in [place].

## 2020-02-23 LAB — CBC WITH DIFFERENTIAL/PLATELET
Abs Immature Granulocytes: 0.01 10*3/uL (ref 0.00–0.07)
Basophils Absolute: 0.1 10*3/uL (ref 0.0–0.1)
Basophils Relative: 1 %
Eosinophils Absolute: 0.3 10*3/uL (ref 0.0–0.5)
Eosinophils Relative: 6 %
HCT: 41.2 % (ref 39.0–52.0)
Hemoglobin: 13.8 g/dL (ref 13.0–17.0)
Immature Granulocytes: 0 %
Lymphocytes Relative: 43 %
Lymphs Abs: 2.2 10*3/uL (ref 0.7–4.0)
MCH: 29.7 pg (ref 26.0–34.0)
MCHC: 33.5 g/dL (ref 30.0–36.0)
MCV: 88.6 fL (ref 80.0–100.0)
Monocytes Absolute: 0.5 10*3/uL (ref 0.1–1.0)
Monocytes Relative: 10 %
Neutro Abs: 2 10*3/uL (ref 1.7–7.7)
Neutrophils Relative %: 40 %
Platelets: 279 10*3/uL (ref 150–400)
RBC: 4.65 MIL/uL (ref 4.22–5.81)
RDW: 12 % (ref 11.5–15.5)
WBC: 5 10*3/uL (ref 4.0–10.5)
nRBC: 0 % (ref 0.0–0.2)

## 2020-02-23 LAB — BASIC METABOLIC PANEL
Anion gap: 8 (ref 5–15)
BUN: 5 mg/dL — ABNORMAL LOW (ref 6–20)
CO2: 26 mmol/L (ref 22–32)
Calcium: 8.7 mg/dL — ABNORMAL LOW (ref 8.9–10.3)
Chloride: 105 mmol/L (ref 98–111)
Creatinine, Ser: 1.04 mg/dL (ref 0.61–1.24)
GFR calc Af Amer: >60 mL/min (ref >60)
GFR calc non Af Amer: >60 mL/min (ref >60)
Glucose, Bld: 99 mg/dL (ref 70–99)
Potassium: 3.9 mmol/L (ref 3.5–5.1)
Sodium: 139 mmol/L (ref 135–145)

## 2020-02-23 LAB — HEPATITIS PANEL, ACUTE
HCV Ab: NONREACTIVE
Hep A IgM: NONREACTIVE
Hep B C IgM: NONREACTIVE
Hepatitis B Surface Ag: NONREACTIVE

## 2020-02-23 LAB — MAGNESIUM: Magnesium: 2.1 mg/dL (ref 1.7–2.4)

## 2020-02-23 LAB — CK: Total CK: 3095 U/L — ABNORMAL HIGH (ref 49–397)

## 2020-02-23 MED ORDER — LEVETIRACETAM 500 MG PO TABS
500.0000 mg | ORAL_TABLET | Freq: Two times a day (BID) | ORAL | Status: DC
  Administered 2020-02-23 – 2020-02-25 (×5): 500 mg via ORAL
  Filled 2020-02-23 (×5): qty 1

## 2020-02-23 NOTE — Progress Notes (Signed)
PROGRESS NOTE    Casey Lyons   WUJ:[id number]  DOB: [birth year]  DOA: 02/20/2020     2  [facility]  CC: loss of consciousness/muscle cramping/spasms  Hospital Course: Mr. Casey Lyons is a 29 yo AA male with PMH HTN, muscle spasms who was admitted to the hospital after an episode of severe muscle spasm/cramping.  He described an episode where his hands and legs had "locked up" and were almost shaking.  There was concern for possible seizure-like activity although there was no described tongue biting or urinary incontinence.  His limbs were noted to be rigid at time of EMS arrival. 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  He endorses staying hydrated during the day, notably drinking up to a gallon of water but also tries to intake electrolyte solutions as well.  He endorses to similar prior episodes in life as well.  He does take baclofen as needed for his muscle spasms. On work-up he was found to have rhabdomyolysis with CK approximately 5000.  He also underwent CT head and MRI brain to rule out stroke and neurology was consulted for EEG. EEG was negative for sz activity and MRI brain was negative for obvious acute abnormalities.  There were a few nonspecific scattered punctate foci in the cerebral right matter of unclear significance but likely not clinically relevant. He was started on Keppra per neurology due to high suspicion that patient had a seizure prior to presentation.  Patient was also instructed to refrain from driving per neurology.  He will follow-up outpatient with neuromuscular (Dr. Allena Katz) and referral was placed during hospitalization from neurology.  With further work-up and obtaining further history from patient, his CK did not significantly respond to IV fluids.  He was also found to have elevated liver enzymes, elevated LDH, and he also started endorsing history of a vague rash on his skin that he experienced intermittently.  He also then  endorsed a further family history of lupus.  He will certainly need referral to rheumatology at follow-up after discharge.   Interval History:  No acute events overnight.  Late yesterday, he endorsed more family history of autoimmune disease.  He also endorsed a history of head trauma, specifically one time in the [employer] he says his parachute did not open and he had landed in trees then fell over 100 feet to the ground.  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  Old records reviewed in assessment of this patient  ROS: Constitutional: negative for fatigue and fevers, Respiratory: negative for cough, Cardiovascular: negative for chest pain and Gastrointestinal: negative for abdominal pain  Assessment & Plan: Rhabdomyolysis - CK ~ 5000 on admission; differential for etiology includes dehydration leading to rhabdomyolysis versus seizure precipitating rhabdo.  Seizure has now been ruled out as a feasible cause.  After further work-up and history taking, there may also be other underlying contributing etiologies.  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  He has described episodes in the past of vague skin manifestations/rashes of unknown origin or trigger.  He also endorses muscle soreness that is out of proportion to the activity being done along with his physical exertion while outside.  There is also a family history of autoimmune disease -CK has been unresponsive to fluids which also raises suspicion of underlying myositis.  LDH is also elevated as well as LFTs which  are also commonly seen with myositis.  At this time will initiate some further work-up with anti-Ro, anti-La, and anti-Smith antibodies.  Will also check ANA.  He will need outpatient referral to rheumatology -Continue trending CK, if refractory to treatment, will likely need to pursue further  inpatient work-up as he would not be considered safe/stable for discharging without further input -Troponin has normalized with fluids -On 02/03/2020, CK started trending down.  Will continue on fluids until further downtrend  Essential hypertension -Continue amlodipine  Syncope -Etiology at this time includes possible dehydration/physical overexertion versus due to possible seizure (EEG and MRI brain normal) -Per neurology, patient started on Keppra.  Continue on 500 mg twice daily.  Outpatient follow-up with neuromuscular -Continue on telemetry monitoring  Hyponatremia -Mild and not severe enough to contribute to a seizure however does pose concern for possible undetected hyponatremia at other times.  Etiology would either be due to excessive water intake versus volume depletion from dehydration/sweating -Monitor sodium while on fluids.  Has responded well to fluids and he was likely volume depleted/dehydrated despite his water intake that he states  AKI (acute kidney injury) (HCC) -Appears that this was related to probable dehydration.  Less likely to be a nephropathy from rhabdo given CK not high enough as typically expected as well as normalization overnight with fluids -Continue fluids and monitoring BMP   Antimicrobials: N/A  DVT prophylaxis: Lovenox Code Status: Full Family Communication: Wife  Disposition Plan:  Status is: Observation  The patient will require care spanning > 2 midnights and should be moved to inpatient because: Persistent severe electrolyte disturbances, IV treatments appropriate due to intensity of illness or inability to take PO and Inpatient level of care appropriate due to severity of illness  Dispo: The patient is from: Home              Anticipated d/c is to: Home              Anticipated d/c date is: 2 days              Patient currently is not medically stable to d/c.   Objective: Blood pressure (!) 135/88, pulse 68, temperature 99.1 F (37.3  C), temperature source Oral, resp. rate 18, height 5\' 10"  (1.778 m), weight (!) 106.3 kg, SpO2 97 %.  Examination: General appearance: alert, cooperative and no distress Head: Normocephalic, without obvious abnormality, atraumatic Eyes: EOMI Lungs: clear to auscultation bilaterally Heart: regular rate and rhythm and S1, S2 normal Abdomen: Tender to palpation nonspecifically, bowel sounds present, nondistended Extremities: No edema.  Tenderness to palpation of quadricep muscles, improved Skin: mobility and turgor normal Neurologic: Grossly normal   Consultants:   Neurology  Procedures:   EEG, 02/21/2020.  No seizure activity appreciated  Data Reviewed: I have personally reviewed following labs and imaging studies Results for orders placed or performed during the hospital encounter of 02/20/20 (from the past 24 hour(s))  Lactate dehydrogenase     Status: Abnormal   Collection Time: 02/22/20  2:17 PM  Result Value Ref Range   LDH 265 (H) 98 - 192 U/L  Hepatitis panel, acute     Status: None   Collection Time: 02/22/20  2:17 PM  Result Value Ref Range   Hepatitis B Surface Ag NON REACTIVE NON REACTIVE   HCV Ab NON REACTIVE NON REACTIVE   Hep A IgM NON REACTIVE NON REACTIVE   Hep B C IgM NON REACTIVE NON REACTIVE  Basic metabolic panel  Status: Abnormal   Collection Time: 02/23/20  6:20 AM  Result Value Ref Range   Sodium 139 135 - 145 mmol/L   Potassium 3.9 3.5 - 5.1 mmol/L   Chloride 105 98 - 111 mmol/L   CO2 26 22 - 32 mmol/L   Glucose, Bld 99 70 - 99 mg/dL   BUN 5 (L) 6 - 20 mg/dL   Creatinine, Ser 5.28 0.61 - 1.24 mg/dL   Calcium 8.7 (L) 8.9 - 10.3 mg/dL   GFR calc non Af Amer >60 >60 mL/min   GFR calc Af Amer >60 >60 mL/min   Anion gap 8 5 - 15  CBC with Differential/Platelet     Status: None   Collection Time: 02/23/20  6:20 AM  Result Value Ref Range   WBC 5.0 4.0 - 10.5 K/uL   RBC 4.65 4.22 - 5.81 MIL/uL   Hemoglobin 13.8 13.0 - 17.0 g/dL   HCT 41.3 39 - 52  %   MCV 88.6 80.0 - 100.0 fL   MCH 29.7 26.0 - 34.0 pg   MCHC 33.5 30.0 - 36.0 g/dL   RDW 24.4 01.0 - 27.2 %   Platelets 279 150 - 400 K/uL   nRBC 0.0 0.0 - 0.2 %   Neutrophils Relative % 40 %   Neutro Abs 2.0 1.7 - 7.7 K/uL   Lymphocytes Relative 43 %   Lymphs Abs 2.2 0.7 - 4.0 K/uL   Monocytes Relative 10 %   Monocytes Absolute 0.5 0 - 1 K/uL   Eosinophils Relative 6 %   Eosinophils Absolute 0.3 0 - 0 K/uL   Basophils Relative 1 %   Basophils Absolute 0.1 0 - 0 K/uL   Immature Granulocytes 0 %   Abs Immature Granulocytes 0.01 0.00 - 0.07 K/uL  Magnesium     Status: None   Collection Time: 02/23/20  6:20 AM  Result Value Ref Range   Magnesium 2.1 1.7 - 2.4 mg/dL  CK     Status: Abnormal   Collection Time: 02/23/20  6:20 AM  Result Value Ref Range   Total CK 3,095 (H) 49.0 - 397.0 U/L    Recent Results (from the past 240 hour(s))  SARS Coronavirus 2 by RT PCR     Status: None   Collection Time: 02/21/20 12:41 AM  Result Value Ref Range Status   SARS Coronavirus 2 NEGATIVE NEGATIVE Final    Comment: (NOTE) SARS-CoV-2 target nucleic acids are NOT DETECTED.  The SARS-CoV-2 RNA is generally detectable in upper and lower respiratory specimens during the acute phase of infection. The lowest concentration of SARS-CoV-2 viral copies this assay can detect is 250 copies / mL. A negative result does not preclude SARS-CoV-2 infection and should not be used as the sole basis for treatment or other patient management decisions.  A negative result may occur with improper specimen collection / handling, submission of specimen other than nasopharyngeal swab, presence of viral mutation(s) within the areas targeted by this assay, and inadequate number of viral copies (<250 copies / mL). A negative result must be combined with clinical observations, patient history, and epidemiological information.  Fact Sheet for Patients:   BoilerBrush.com.cy  Fact Sheet for  Healthcare Providers: https://pope.com/  This test is not yet approved or  cleared by the [place] FDA and has been authorized for detection and/or diagnosis of SARS-CoV-2 by FDA under an Emergency Use Authorization (EUA).  This EUA will remain in effect (meaning this test can be used) for the duration  of the COVID-19 declaration under Section 564(b)(1) of the Act, 21 U.S.C. section 360bbb-3(b)(1), unless the authorization is terminated or revoked sooner.  Performed at [facility], [address] [place], [place] [address]      Radiology Studies: No results found. MR BRAIN WO CONTRAST  Final Result    CT HEAD WO CONTRAST  Final Result       Scheduled Meds: . amLODipine  10 mg Oral Daily  . enoxaparin (LOVENOX) injection  40 mg Subcutaneous Q24H  . levETIRAcetam  500 mg Oral BID  . montelukast  10 mg Oral QHS   PRN Meds: diphenhydrAMINE-zinc acetate, hydrOXYzine, ondansetron **OR** ondansetron (ZOFRAN) IV Continuous Infusions: . sodium chloride 150 mL/hr at 02/23/20 0915  . lactated ringers Stopped (02/21/20 0222)      LOS: 2 days  Time spent: Greater than 50% of the 35 minute visit was spent in counseling/coordination of care for the patient as laid out in the A&P.   Lewie Chamberavid Edwina Grossberg, MD [facility] 02/23/2020, 12:51 PM   Contact via secure chat.  To contact the attending provider between 7A-7P or the covering provider during after hours 7P-7A, please log into the web site [email or web address] and access using universal [place] password for that web site. If you do not have the password, please call the hospital operator.

## 2020-02-24 LAB — BASIC METABOLIC PANEL
Anion gap: 7 (ref 5–15)
BUN: 7 mg/dL (ref 6–20)
CO2: 27 mmol/L (ref 22–32)
Calcium: 8.9 mg/dL (ref 8.9–10.3)
Chloride: 107 mmol/L (ref 98–111)
Creatinine, Ser: 1.05 mg/dL (ref 0.61–1.24)
GFR calc Af Amer: >60 mL/min (ref >60)
GFR calc non Af Amer: >60 mL/min (ref >60)
Glucose, Bld: 94 mg/dL (ref 70–99)
Potassium: 4.2 mmol/L (ref 3.5–5.1)
Sodium: 141 mmol/L (ref 135–145)

## 2020-02-24 LAB — CBC WITH DIFFERENTIAL/PLATELET
Abs Immature Granulocytes: 0.01 10*3/uL (ref 0.00–0.07)
Basophils Absolute: 0.1 10*3/uL (ref 0.0–0.1)
Basophils Relative: 1 %
Eosinophils Absolute: 0.4 10*3/uL (ref 0.0–0.5)
Eosinophils Relative: 8 %
HCT: 42.3 % (ref 39.0–52.0)
Hemoglobin: 14.1 g/dL (ref 13.0–17.0)
Immature Granulocytes: 0 %
Lymphocytes Relative: 37 %
Lymphs Abs: 1.9 10*3/uL (ref 0.7–4.0)
MCH: 29 pg (ref 26.0–34.0)
MCHC: 33.3 g/dL (ref 30.0–36.0)
MCV: 86.9 fL (ref 80.0–100.0)
Monocytes Absolute: 0.6 10*3/uL (ref 0.1–1.0)
Monocytes Relative: 11 %
Neutro Abs: 2.3 10*3/uL (ref 1.7–7.7)
Neutrophils Relative %: 43 %
Platelets: 276 10*3/uL (ref 150–400)
RBC: 4.87 MIL/uL (ref 4.22–5.81)
RDW: 11.8 % (ref 11.5–15.5)
WBC: 5.2 10*3/uL (ref 4.0–10.5)
nRBC: 0 % (ref 0.0–0.2)

## 2020-02-24 LAB — SJOGRENS SYNDROME-B EXTRACTABLE NUCLEAR ANTIBODY: SSB (La) (ENA) Antibody, IgG: <0.2 AI (ref 0.0–0.9)

## 2020-02-24 LAB — HEPATIC FUNCTION PANEL
ALT: 51 U/L — ABNORMAL HIGH (ref 0–44)
AST: 50 U/L — ABNORMAL HIGH (ref 15–41)
Albumin: 3.3 g/dL — ABNORMAL LOW (ref 3.5–5.0)
Alkaline Phosphatase: 38 U/L (ref 38–126)
Bilirubin, Direct: <0.1 mg/dL (ref 0.0–0.2)
Total Bilirubin: 0.6 mg/dL (ref 0.3–1.2)
Total Protein: 6.6 g/dL (ref 6.5–8.1)

## 2020-02-24 LAB — MAGNESIUM: Magnesium: 1.9 mg/dL (ref 1.7–2.4)

## 2020-02-24 LAB — ANA W/REFLEX IF POSITIVE: Anti Nuclear Antibody (ANA): NEGATIVE

## 2020-02-24 LAB — ANTI-SMITH ANTIBODY: ENA SM Ab Ser-aCnc: <0.2 AI (ref 0.0–0.9)

## 2020-02-24 LAB — LACTATE DEHYDROGENASE: LDH: 188 U/L (ref 98–192)

## 2020-02-24 LAB — SJOGRENS SYNDROME-A EXTRACTABLE NUCLEAR ANTIBODY: SSA (Ro) (ENA) Antibody, IgG: <0.2 AI (ref 0.0–0.9)

## 2020-02-24 LAB — CK: Total CK: 2045 U/L — ABNORMAL HIGH (ref 49–397)

## 2020-02-24 NOTE — Progress Notes (Signed)
PROGRESS NOTE    Casey Lyons   KGY:[id number]  DOB: 1991-02-08  DOA: 02/20/2020     3  [facility]  CC: loss of consciousness/muscle cramping/spasms  Hospital Course: Casey Lyons is a 29 yo AA male with PMH HTN, muscle spasms who was admitted to the hospital after an episode of severe muscle spasm/cramping.  He described an episode where his hands and legs had "locked up" and were almost shaking.  There was concern for possible seizure-like activity although there was no described tongue biting or urinary incontinence.  His limbs were noted to be rigid at time of EMS arrival. 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  He endorses staying hydrated during the day, notably drinking up to a gallon of water but also tries to intake electrolyte solutions as well.  He endorses to similar prior episodes in life as well.  He does take baclofen as needed for his muscle spasms. On work-up he was found to have rhabdomyolysis with CK approximately 5000.  He also underwent CT head and MRI brain to rule out stroke and neurology was consulted for EEG. EEG was negative for sz activity and MRI brain was negative for obvious acute abnormalities.  There were a few nonspecific scattered punctate foci in the cerebral right matter of unclear significance but likely not clinically relevant. He was started on Keppra per neurology due to high suspicion that patient had a seizure prior to presentation.  Patient was also instructed to refrain from driving per neurology.  He will follow-up outpatient with neuromuscular (Dr. Allena Katz) and referral was placed during hospitalization from neurology.  With further work-up and obtaining further history from patient, his CK did not significantly respond to IV fluids.  He was also found to have elevated liver enzymes, elevated LDH, and he also started endorsing history of a vague rash on his skin that he experienced intermittently.  He also then  endorsed a further family history of lupus.  He will certainly need referral to rheumatology at follow-up after discharge.  His CK overall continued to downtrend with monitoring.  His muscle soreness persisted but improved daily. He will need follow up with neurology at discharge regarding starting on Keppra during hospitalization and referral to rheumatology for consideration of further myositis workup. Labs pending at time of discharge.    Interval History:  No acute events overnight.  Still sore but overall says it's getting better daily. Mostly in legs and arms. Has chronic back soreness he says. Getting restless and hoping to go home soon.   Old records reviewed in assessment of this patient  ROS: Constitutional: negative for fatigue and fevers, Respiratory: negative for cough, Cardiovascular: negative for chest pain and Gastrointestinal: negative for abdominal pain  Assessment & Plan: Rhabdomyolysis - CK ~ 5000 on admission; differential for etiology includes dehydration leading to rhabdomyolysis versus seizure precipitating rhabdo.  Seizure has now been ruled out as a feasible cause.  After further work-up and history taking, there may also be other underlying contributing etiologies.  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  He has described episodes in the past of vague skin manifestations/rashes of unknown origin or trigger.  He also endorses muscle soreness that is out of proportion to the activity being done along with his physical exertion while outside.  There is also a family history of autoimmune disease -CK was initially unresponsive to fluids which also raised suspicion of underlying myositis.  LDH is also elevated as well as LFTs which are also commonly seen with myositis. Workup started; follow up anti-Ro, anti-La, and anti-Smith antibodies.  Will also check ANA.  He will need outpatient referral to rheumatology thru the [place]  most likely.  -Continue trending CK, responding better now with gradual downtrend; likely can d/c home on Tues or Wed -Troponin has normalized with fluids  Essential hypertension -Continue amlodipine  Syncope -Etiology at this time includes possible dehydration/physical overexertion versus due to possible seizure (EEG and MRI brain normal) -Per neurology, patient started on Keppra.  Continue on 500 mg twice daily.  Outpatient follow-up with neuromuscular -Continue on telemetry monitoring  Hyponatremia -Mild and not severe enough to contribute to a seizure however does pose concern for possible undetected hyponatremia at other times.  Etiology would either be due to excessive water intake versus volume depletion from dehydration/sweating -Monitor sodium while on fluids.  Has responded well to fluids and he was likely volume depleted/dehydrated despite his water intake that he states  AKI (acute kidney injury) (HCC) -Appears that this was related to probable dehydration.  Less likely to be a nephropathy from rhabdo given CK not high enough as typically expected as well as normalization overnight with fluids -Continue fluids and monitoring BMP   Antimicrobials: N/A  DVT prophylaxis: Lovenox Code Status: Full Family Communication: Wife  Disposition Plan:  Status is: Observation  The patient will require care spanning > 2 midnights and should be moved to inpatient because: Persistent severe electrolyte disturbances, IV treatments appropriate due to intensity of illness or inability to take PO and Inpatient level of care appropriate due to severity of illness  Dispo: The patient is from: Home              Anticipated d/c is to: Home              Anticipated d/c date is: 2 days              Patient currently is not medically stable to d/c.   Objective: Blood pressure 116/79, pulse 54, temperature 98.2 F (36.8 C), temperature source Oral, resp. rate 17, height 5\' 10"  (1.778 m),  weight (!) 106.1 kg, SpO2 99 %.  Examination: General appearance: alert, cooperative and no distress Head: Normocephalic, without obvious abnormality, atraumatic Eyes: EOMI Lungs: clear to auscultation bilaterally Heart: regular rate and rhythm and S1, S2 normal Abdomen: Tender to palpation nonspecifically, bowel sounds present, nondistended Extremities: No edema.  Tenderness to palpation of quadricep muscles, improved Skin: mobility and turgor normal Neurologic: Grossly normal   Consultants:   Neurology  Procedures:   EEG, 02/21/2020.  No seizure activity appreciated  Data Reviewed: I have personally reviewed following labs and imaging studies Results for orders placed or performed during the hospital encounter of 02/20/20 (from the past 24 hour(s))  Basic metabolic panel     Status: None   Collection Time: 02/24/20  6:26 AM  Result Value Ref Range   Sodium 141 135 - 145 mmol/L   Potassium 4.2 3.5 - 5.1 mmol/L   Chloride 107 98 - 111 mmol/L   CO2 27 22 - 32 mmol/L   Glucose, Bld 94 70 - 99 mg/dL   BUN 7 6 - 20 mg/dL   Creatinine, Ser 02/26/20 0.61 - 1.24 mg/dL   Calcium 8.9 8.9 - 3.50 mg/dL   GFR calc non Af Amer >60 >60 mL/min   GFR calc Af Amer >60 >60 mL/min   Anion gap 7 5 -  15  CBC with Differential/Platelet     Status: None   Collection Time: 02/24/20  6:26 AM  Result Value Ref Range   WBC 5.2 4.0 - 10.5 K/uL   RBC 4.87 4.22 - 5.81 MIL/uL   Hemoglobin 14.1 13.0 - 17.0 g/dL   HCT 64.3 39 - 52 %   MCV 86.9 80.0 - 100.0 fL   MCH 29.0 26.0 - 34.0 pg   MCHC 33.3 30.0 - 36.0 g/dL   RDW 32.9 51.8 - 84.1 %   Platelets 276 150 - 400 K/uL   nRBC 0.0 0.0 - 0.2 %   Neutrophils Relative % 43 %   Neutro Abs 2.3 1.7 - 7.7 K/uL   Lymphocytes Relative 37 %   Lymphs Abs 1.9 0.7 - 4.0 K/uL   Monocytes Relative 11 %   Monocytes Absolute 0.6 0 - 1 K/uL   Eosinophils Relative 8 %   Eosinophils Absolute 0.4 0 - 0 K/uL   Basophils Relative 1 %   Basophils Absolute 0.1 0 - 0 K/uL     Immature Granulocytes 0 %   Abs Immature Granulocytes 0.01 0.00 - 0.07 K/uL  Magnesium     Status: None   Collection Time: 02/24/20  6:26 AM  Result Value Ref Range   Magnesium 1.9 1.7 - 2.4 mg/dL  CK     Status: Abnormal   Collection Time: 02/24/20  6:26 AM  Result Value Ref Range   Total CK 2,045 (H) 49.0 - 397.0 U/L    Recent Results (from the past 240 hour(s))  SARS Coronavirus 2 by RT PCR     Status: None   Collection Time: 02/21/20 12:41 AM  Result Value Ref Range Status   SARS Coronavirus 2 NEGATIVE NEGATIVE Final    Comment: (NOTE) SARS-CoV-2 target nucleic acids are NOT DETECTED.  The SARS-CoV-2 RNA is generally detectable in upper and lower respiratory specimens during the acute phase of infection. The lowest concentration of SARS-CoV-2 viral copies this assay can detect is 250 copies / mL. A negative result does not preclude SARS-CoV-2 infection and should not be used as the sole basis for treatment or other patient management decisions.  A negative result may occur with improper specimen collection / handling, submission of specimen other than nasopharyngeal swab, presence of viral mutation(s) within the areas targeted by this assay, and inadequate number of viral copies (<250 copies / mL). A negative result must be combined with clinical observations, patient history, and epidemiological information.  Fact Sheet for Patients:   BoilerBrush.com.cy  Fact Sheet for Healthcare Providers: https://pope.com/  This test is not yet approved or  cleared by the [place] FDA and has been authorized for detection and/or diagnosis of SARS-CoV-2 by FDA under an Emergency Use Authorization (EUA).  This EUA will remain in effect (meaning this test can be used) for the duration of the COVID-19 declaration under Section 564(b)(1) of the Act, 21 U.S.C. section 360bbb-3(b)(1), unless the authorization is terminated or revoked  sooner.  Performed at [facility], [address], [place], [place] [address]      Radiology Studies: No results found. MR BRAIN WO CONTRAST  Final Result    CT HEAD WO CONTRAST  Final Result       Scheduled Meds: . amLODipine  10 mg Oral Daily  . enoxaparin (LOVENOX) injection  40 mg Subcutaneous Q24H  . levETIRAcetam  500 mg Oral BID  . montelukast  10 mg Oral QHS   PRN Meds:  diphenhydrAMINE-zinc acetate, hydrOXYzine, ondansetron **OR** ondansetron (ZOFRAN) IV Continuous Infusions: . sodium chloride 150 mL/hr at 02/23/20 1900  . lactated ringers Stopped (02/21/20 0222)      LOS: 3 days  Time spent: Greater than 50% of the 35 minute visit was spent in counseling/coordination of care for the patient as laid out in the A&P.   Lewie Chamberavid Baylei Siebels, MD [facility] 02/24/2020, 8:27 AM   Contact via secure chat.  To contact the attending provider between 7A-7P or the covering provider during after hours 7P-7A, please log into the web site [email or web address] and access using universal [place] password for that web site. If you do not have the password, please call the hospital operator.

## 2020-02-25 LAB — CBC WITH DIFFERENTIAL/PLATELET
Abs Immature Granulocytes: 0.02 10*3/uL (ref 0.00–0.07)
Basophils Absolute: 0.1 10*3/uL (ref 0.0–0.1)
Basophils Relative: 1 %
Eosinophils Absolute: 0.3 10*3/uL (ref 0.0–0.5)
Eosinophils Relative: 5 %
HCT: 43.2 % (ref 39.0–52.0)
Hemoglobin: 14.7 g/dL (ref 13.0–17.0)
Immature Granulocytes: 0 %
Lymphocytes Relative: 33 %
Lymphs Abs: 2 10*3/uL (ref 0.7–4.0)
MCH: 29.8 pg (ref 26.0–34.0)
MCHC: 34 g/dL (ref 30.0–36.0)
MCV: 87.4 fL (ref 80.0–100.0)
Monocytes Absolute: 0.6 10*3/uL (ref 0.1–1.0)
Monocytes Relative: 9 %
Neutro Abs: 3.2 10*3/uL (ref 1.7–7.7)
Neutrophils Relative %: 52 %
Platelets: 318 10*3/uL (ref 150–400)
RBC: 4.94 MIL/uL (ref 4.22–5.81)
RDW: 11.8 % (ref 11.5–15.5)
WBC: 6.1 10*3/uL (ref 4.0–10.5)
nRBC: 0 % (ref 0.0–0.2)

## 2020-02-25 LAB — BASIC METABOLIC PANEL
Anion gap: 8 (ref 5–15)
BUN: 10 mg/dL (ref 6–20)
CO2: 27 mmol/L (ref 22–32)
Calcium: 9.1 mg/dL (ref 8.9–10.3)
Chloride: 106 mmol/L (ref 98–111)
Creatinine, Ser: 1.18 mg/dL (ref 0.61–1.24)
GFR calc Af Amer: >60 mL/min (ref >60)
GFR calc non Af Amer: >60 mL/min (ref >60)
Glucose, Bld: 98 mg/dL (ref 70–99)
Potassium: 4 mmol/L (ref 3.5–5.1)
Sodium: 141 mmol/L (ref 135–145)

## 2020-02-25 LAB — MAGNESIUM: Magnesium: 2 mg/dL (ref 1.7–2.4)

## 2020-02-25 LAB — CK: Total CK: 1556 U/L — ABNORMAL HIGH (ref 49–397)

## 2020-02-25 MED ORDER — LEVETIRACETAM 500 MG PO TABS: 500.0000 mg | ORAL_TABLET | Freq: Two times a day (BID) | ORAL | 0 refills | Status: AC

## 2020-02-25 MED FILL — levETIRAcetam 500 MG TABS: 500 | 30 days supply | Qty: 60 | Fill #0

## 2020-02-25 NOTE — Progress Notes (Signed)
D/C instructions given and reviewed. No questions voiced at this time. Tele and IV removed. Tolerated well. States his ride is on the way. Awaiting meds from [facility] pharmacy.

## 2020-02-25 NOTE — Progress Notes (Signed)
Patient was discharged home by MD order; discharged instructions  Reviewed and given to patient with care notes; IV DIC; skin intact; patient will be escorted to the car by SWOT RN via wheelchair.

## 2020-02-25 NOTE — Discharge Summary (Signed)
Physician Discharge Summary  Casey Lyons KNL:[id number] DOB: March 20, 1991 DOA: 02/20/2020  PCP: [facility]  Admit date: 02/20/2020 Discharge date: 02/25/2020  Admitted From: Home Disposition: Home  Recommendations for Outpatient Follow-up:  1. Follow up with PCP in 1-2 weeks 2. Please obtain BMP/CBC in one week your next doctors visit.  3. Keppra prescribed, outpatient follow-up with neurology given 4. Outpatient referral for rheumatology given 5. Advised oral hydration at home   Discharge Condition: Stable CODE STATUS: Full code Diet recommendation: Heart healthy  Brief/Interim Summary: 29 yo AA male with PMH HTN, muscle spasms who was admitted to the hospital after an episode of severe muscle spasm/cramping.  He described an episode where his hands and legs had "locked up" and were almost shaking.  There was concern for possible seizure-like activity although there was no described tongue biting or urinary incontinence.  His limbs were noted to be rigid at time of EMS arrival. 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  He endorses staying hydrated during the day, notably drinking up to a gallon of water but also tries to intake electrolyte solutions as well.  He endorses to similar prior episodes in life as well.  He does take baclofen as needed for his muscle spasms. On work-up he was found to have rhabdomyolysis with CK approximately 5000.  He also underwent CT head and MRI brain to rule out stroke and neurology was consulted for EEG. EEG was negative for sz activity and MRI brain was negative for obvious acute abnormalities.  There were a few nonspecific scattered punctate foci in the cerebral right matter of unclear significance but likely not clinically relevant. He was started on Keppra per neurology due to high suspicion that patient had a seizure prior to presentation.  Patient was also instructed to refrain from driving per neurology.  He will  follow-up outpatient with neuromuscular (Dr. Allena Katz) and referral was placed during hospitalization from neurology.  With further work-up and obtaining further history from patient, his CK did not significantly respond to IV fluids.  He was also found to have elevated liver enzymes, elevated LDH, and he also started endorsing history of a vague rash on his skin that he experienced intermittently.  He also then endorsed a further family history of lupus.   Today patient feels great, CK levels trended down.  Wants to go home.  Body mass index is 33.37 kg/m.     Acute rhabdomyolysis Syncope with question of seizure -Unclear etiology possibly secondary to seizure.  Started on Keppra.  Autoimmune work-up has been sent, outpatient referral to rheumatology and neurology has been given.  CK levels have now trended down -Seen by neurology team.  EEG and MRI are negative  Essential hypertension -Resume home meds  Acute kidney injury secondary to dehydration -Resolved  Mild hyponatremia, resolved -Secondary to dehydration   Discharge Diagnoses:  Active Problems:   Essential hypertension   Rhabdomyolysis      Consultations:  Neurology  Subjective: Feels great no complaints denies any muscle pain chest pain any other complaints  Discharge Exam: Vitals:   02/24/20 2035 02/25/20 0401  BP: (!) 151/94 (!) 135/71  Pulse: 62 50  Resp: 20 20  Temp: 98.6 F (37 C)   SpO2: 98% 100%   Vitals:   02/24/20 0944 02/24/20 1228 02/24/20 2035 02/25/20 0401  BP: (!) 110/61 (!) 128/64 (!) 151/94 (!) 135/71  Pulse: 59 71 62 50  Resp: 14 20 20 20   Temp:  98.2 F (36.8 C) 98.6 F (37 C)   TempSrc:  Oral Oral   SpO2: 99% 100% 98% 100%  Weight:    (!) 105.5 kg  Height:        General: Pt is alert, awake, not in acute distress Cardiovascular: RRR, S1/S2 +, no rubs, no gallops Respiratory: CTA bilaterally, no wheezing, no rhonchi Abdominal: Soft, NT, ND, bowel sounds + Extremities: no  edema, no cyanosis  Discharge Instructions  Discharge Instructions    Ambulatory referral to Neurology   Complete by: As directed    An appointment is requested in approximately: 4 weeks. Evaluate for potential myositis   Ambulatory referral to Rheumatology   Complete by: As directed    Work up for Myositis     Allergies as of 02/25/2020   No Known Allergies     Medication List    TAKE these medications   amLODipine 10 MG tablet Commonly known as: NORVASC Take 10 mg by mouth daily.   baclofen 10 MG tablet Commonly known as: LIORESAL Take 10 mg by mouth daily as needed for muscle spasms.   GARLIC PO Take 1 tablet by mouth daily.   levETIRAcetam 500 MG tablet Commonly known as: KEPPRA Take 1 tablet (500 mg total) by mouth 2 (two) times daily.   meloxicam 15 MG tablet Commonly known as: MOBIC Take 15 mg by mouth daily as needed for pain.   montelukast 10 MG tablet Commonly known as: SINGULAIR Take 10 mg by mouth at bedtime.       Follow-up Information    [facility] Schedule an appointment as soon as possible for a visit in 1 week(s).   Contact information: [address] [facility] [place] [address] [phone]        Glendale Chard, DO. Schedule an appointment as soon as possible for a visit in 1 week(s).   Specialty: Neurology Contact information: [address] [place] [address] [phone]              No Known Allergies  You were cared for by a hospitalist during your hospital stay. If you have any questions about your discharge medications or the care you received while you were in the hospital after you are discharged, you can call the unit and asked to speak with the hospitalist on call if the hospitalist that took care of you is not available. Once you are discharged, your primary care physician will handle any further medical issues. Please note that no refills for any discharge medications  will be authorized once you are discharged, as it is imperative that you return to your primary care physician (or establish a relationship with a primary care physician if you do not have one) for your aftercare needs so that they can reassess your need for medications and monitor your lab values.   Procedures/Studies: EEG  Result Date: 02/21/2020 Charlsie Quest, MD     02/21/2020 11:55 AM Patient Name: Juell Radney MRN: [id number] Epilepsy Attending: Charlsie Quest Referring Physician/Provider: Dr Midge Minium Date: 02/21/2020 Duration: 25.40 mins Patient history: 29 year old male with an episode of altered mental status, right arm stiffness, eye blinking that sounds most consistent with a complex partial seizure. EEG to evaluate for seizure. Level of alertness: Awake, asleep AEDs during EEG study: None Technical aspects: This EEG study was done with scalp electrodes positioned according to the 10-20 International system of electrode placement. Electrical activity was acquired at a sampling rate of  500Hz  and reviewed with a high frequency filter of 70Hz  and a low frequency filter of 1Hz . EEG data were recorded continuously and digitally stored. Description: The posterior dominant rhythm consists of 10.5 Hz activity of moderate voltage (25-35 uV) seen predominantly in posterior head regions, symmetric and reactive to eye opening and eye closing. Sleep was characterized by vertex waves, sleep spindles (12 to 14 Hz), maximal frontocentral region.  Physiology photic driving was not seen during photic stimulation.  Hyperventilation was not performed.   IMPRESSION: This study is within normal limits. No seizures or epileptiform discharges were seen throughout the recording. Charlsie QuestPriyanka O Yadav   CT HEAD WO CONTRAST  Result Date: 02/20/2020 CLINICAL DATA:  Seizure EXAM: CT HEAD WITHOUT CONTRAST TECHNIQUE: Contiguous axial images were obtained from the base of the skull through the vertex without intravenous  contrast. COMPARISON:  None. FINDINGS: Brain: No evidence of acute infarction, hemorrhage, hydrocephalus, extra-axial collection or mass lesion/mass effect. Vascular: No hyperdense vessel or unexpected calcification. Skull: Normal. Negative for fracture or focal lesion. Sinuses/Orbits: Mucous retention cysts in the maxillary sinuses Other: None IMPRESSION: Negative non contrasted CT appearance of the brain. Electronically Signed   By: Jasmine PangKim  Fujinaga M.D.   On: 02/20/2020 22:22   MR BRAIN WO CONTRAST  Result Date: 02/21/2020 CLINICAL DATA:  Mental status change, unknown cause. Additional history provided: Loss of consciousness. EXAM: MRI HEAD WITHOUT CONTRAST TECHNIQUE: Multiplanar, multiecho pulse sequences of the brain and surrounding structures were obtained without intravenous contrast. COMPARISON:  Head CT 02/20/2020 FINDINGS: Brain: The examination is intermittently motion degraded, limiting evaluation. Most notably, there is mild-to-moderate motion degradation of the sagittal T1 weighted sequence, mild-to-moderate motion degradation of the axial T2 weighted sequence, moderate motion degradation of the axial SWI sequence. There are a few scattered punctate foci of T2/FLAIR hyperintensity within the cerebral white matter which are nonspecific. There is no acute infarct. No evidence of intracranial mass. No chronic intracranial blood products. No extra-axial fluid collection. No midline shift. Vascular: Expected proximal arterial flow voids. Skull and upper cervical spine: No focal marrow lesion. Sinuses/Orbits: Visualized orbits show no acute finding. Mild ethmoid sinus mucosal thickening. Small bilateral maxillary sinus mucous retention cyst. No significant mastoid effusion. IMPRESSION: Motion degraded examination as described. No evidence of acute infarct or other acute intracranial abnormality. There are a few scattered nonspecific punctate foci of T2/FLAIR hyperintensity within the cerebral white matter.  Mild ethmoid sinus mucosal thickening. Small bilateral maxillary sinus mucous retention cysts. Electronically Signed   By: Jackey LogeKyle  Golden DO   On: 02/21/2020 08:36      The results of significant diagnostics from this hospitalization (including imaging, microbiology, ancillary and laboratory) are listed below for reference.     Microbiology: Recent Results (from the past 240 hour(s))  SARS Coronavirus 2 by RT PCR     Status: None   Collection Time: 02/21/20 12:41 AM  Result Value Ref Range Status   SARS Coronavirus 2 NEGATIVE NEGATIVE Final    Comment: (NOTE) SARS-CoV-2 target nucleic acids are NOT DETECTED.  The SARS-CoV-2 RNA is generally detectable in upper and lower respiratory specimens during the acute phase of infection. The lowest concentration of SARS-CoV-2 viral copies this assay can detect is 250 copies / mL. A negative result does not preclude SARS-CoV-2 infection and should not be used as the sole basis for treatment or other patient management decisions.  A negative result may occur with improper specimen collection / handling, submission of specimen other than nasopharyngeal swab, presence of  viral mutation(s) within the areas targeted by this assay, and inadequate number of viral copies (<250 copies / mL). A negative result must be combined with clinical observations, patient history, and epidemiological information.  Fact Sheet for Patients:   BoilerBrush.com.cy  Fact Sheet for Healthcare Providers: https://pope.com/  This test is not yet approved or  cleared by the [place] FDA and has been authorized for detection and/or diagnosis of SARS-CoV-2 by FDA under an Emergency Use Authorization (EUA).  This EUA will remain in effect (meaning this test can be used) for the duration of the COVID-19 declaration under Section 564(b)(1) of the Act, 21 U.S.C. section 360bbb-3(b)(1), unless the authorization is terminated  or revoked sooner.  Performed at [facility], [address], [place], [place] [address]      Labs: BNP (last 3 results) No results for input(s): BNP in the last 8760 hours. Basic Metabolic Panel: Recent Labs  Lab 02/20/20 2111 02/20/20 2132 02/21/20 0834 02/22/20 0702 02/23/20 0620 02/24/20 0626 02/25/20 0439  NA   < >  --  137 140 139 141 141  K   < >  --  4.0 4.2 3.9 4.2 4.0  CL   < >  --  105 106 105 107 106  CO2   < >  --  24 26 26 27 27   GLUCOSE   < >  --  119* 99 99 94 98  BUN   < >  --  15 9 5* 7 10  CREATININE   < >  --  1.21 1.05 1.04 1.05 1.18  CALCIUM   < >  --  8.8* 8.7* 8.7* 8.9 9.1  MG   < > 2.0 2.0 2.2 2.1 1.9 2.0  PHOS  --  2.9  --   --   --   --   --    < > = values in this interval not displayed.   Liver Function Tests: Recent Labs  Lab 02/20/20 2111 02/21/20 0834 02/24/20 0626  AST 86* 84* 50*  ALT 51* 45* 51*  ALKPHOS 57 44 38  BILITOT 0.9 0.6 0.6  PROT 8.2* 6.4* 6.6  ALBUMIN 4.6 3.5 3.3*   No results for input(s): LIPASE, AMYLASE in the last 168 hours. No results for input(s): AMMONIA in the last 168 hours. CBC: Recent Labs  Lab 02/21/20 0834 02/22/20 0702 02/23/20 0620 02/24/20 0626 02/25/20 0439  WBC 8.7 5.2 5.0 5.2 6.1  NEUTROABS 5.7 2.3 2.0 2.3 3.2  HGB 14.0 13.6 13.8 14.1 14.7  HCT 41.7 41.8 41.2 42.3 43.2  MCV 87.4 88.7 88.6 86.9 87.4  PLT 299 272 279 276 318   Cardiac Enzymes: Recent Labs  Lab 02/21/20 0834 02/22/20 0702 02/23/20 0620 02/24/20 0626 02/25/20 0439  CKTOTAL 5,225* 5,268* 3,095* 2,045* 1,556*   BNP: Invalid input(s): POCBNP CBG: No results for input(s): GLUCAP in the last 168 hours. D-Dimer No results for input(s): DDIMER in the last 72 hours. Hgb A1c No results for input(s): HGBA1C in the last 72 hours. Lipid Profile No results for input(s): CHOL, HDL, LDLCALC, TRIG, CHOLHDL, LDLDIRECT in the last 72 hours. Thyroid function studies No results for input(s): TSH, T4TOTAL, T3FREE,  THYROIDAB in the last 72 hours.  Invalid input(s): FREET3 Anemia work up No results for input(s): VITAMINB12, FOLATE, FERRITIN, TIBC, IRON, RETICCTPCT in the last 72 hours. Urinalysis    Component Value Date/Time   COLORURINE YELLOW 02/20/2020 2143   APPEARANCEUR HAZY (A) 02/20/2020 2143   LABSPEC  1.008 02/20/2020 2143   PHURINE 6.0 02/20/2020 2143   GLUCOSEU NEGATIVE 02/20/2020 2143   HGBUR SMALL (A) 02/20/2020 2143   BILIRUBINUR NEGATIVE 02/20/2020 2143   KETONESUR NEGATIVE 02/20/2020 2143   PROTEINUR NEGATIVE 02/20/2020 2143   NITRITE NEGATIVE 02/20/2020 2143   LEUKOCYTESUR NEGATIVE 02/20/2020 2143   Sepsis Labs Invalid input(s): PROCALCITONIN,  WBC,  LACTICIDVEN Microbiology Recent Results (from the past 240 hour(s))  SARS Coronavirus 2 by RT PCR     Status: None   Collection Time: 02/21/20 12:41 AM  Result Value Ref Range Status   SARS Coronavirus 2 NEGATIVE NEGATIVE Final    Comment: (NOTE) SARS-CoV-2 target nucleic acids are NOT DETECTED.  The SARS-CoV-2 RNA is generally detectable in upper and lower respiratory specimens during the acute phase of infection. The lowest concentration of SARS-CoV-2 viral copies this assay can detect is 250 copies / mL. A negative result does not preclude SARS-CoV-2 infection and should not be used as the sole basis for treatment or other patient management decisions.  A negative result may occur with improper specimen collection / handling, submission of specimen other than nasopharyngeal swab, presence of viral mutation(s) within the areas targeted by this assay, and inadequate number of viral copies (<250 copies / mL). A negative result must be combined with clinical observations, patient history, and epidemiological information.  Fact Sheet for Patients:   BoilerBrush.com.cy  Fact Sheet for Healthcare Providers: https://pope.com/  This test is not yet approved or  cleared by the  [place] FDA and has been authorized for detection and/or diagnosis of SARS-CoV-2 by FDA under an Emergency Use Authorization (EUA).  This EUA will remain in effect (meaning this test can be used) for the duration of the COVID-19 declaration under Section 564(b)(1) of the Act, 21 U.S.C. section 360bbb-3(b)(1), unless the authorization is terminated or revoked sooner.  Performed at [facility], [address] [place], [place] [address]      Time coordinating discharge:  I have spent 35 minutes face to face with the patient and on the ward discussing the patients care, assessment, plan and disposition with other care givers. >50% of the time was devoted counseling the patient about the risks and benefits of treatment/Discharge disposition and coordinating care.   SIGNED:   Dimple Nanas, MD  [facility] 02/25/2020, 10:51 AM   If 7PM-7AM, please contact night-coverage

## 2021-04-07 IMAGING — CT CT HEAD W/O CM
4 series · 16 of 47 positions shown, 18 images · non-contrast
Comparison: None.

CLINICAL DATA: Seizure

EXAM:
CT HEAD WITHOUT CONTRAST
TECHNIQUE: Contiguous axial images were obtained from the base of the skull
through the vertex without intravenous contrast.

[Series 3: head wo · axial · 0.47mm/px · z∈[+1238,+1358]mm · 7 of 34 slices shown, 9 images]
[im 5/34  brain]
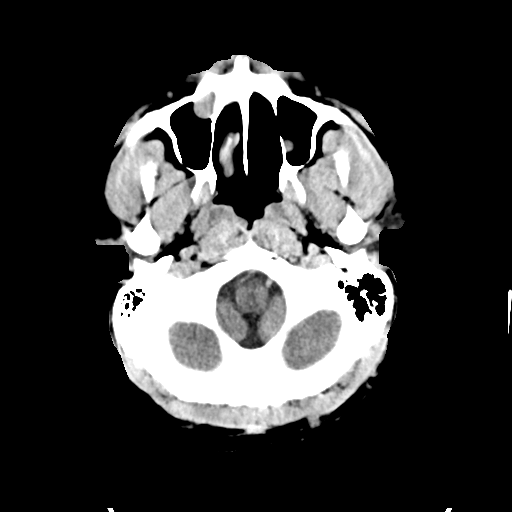
[im 5/34  bone]
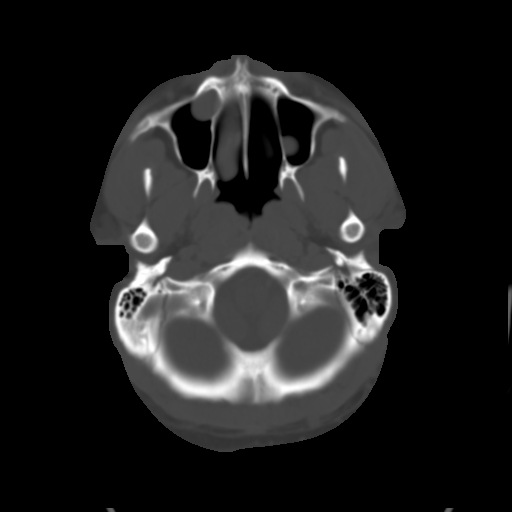
[im 9/34  brain]
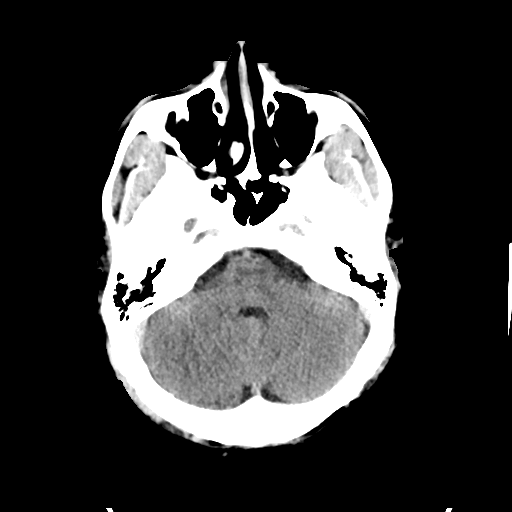
[im 13/34  brain]
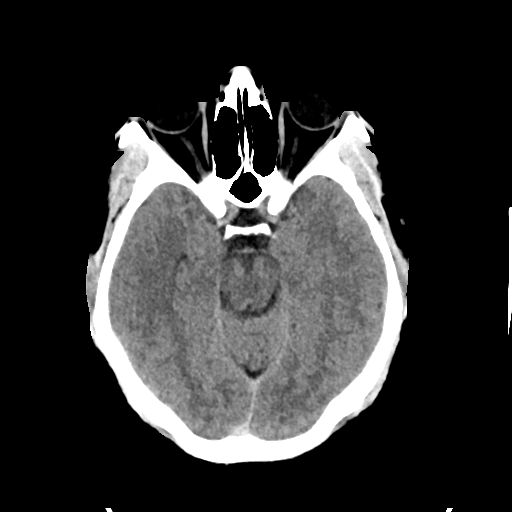
[im 17/34  brain]
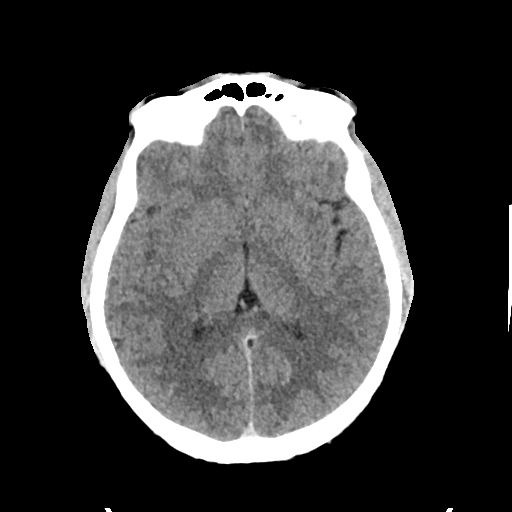
[im 21/34  brain]
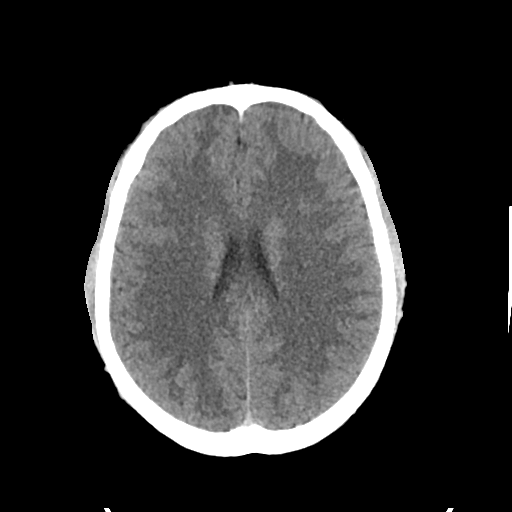
[im 21/34  bone]
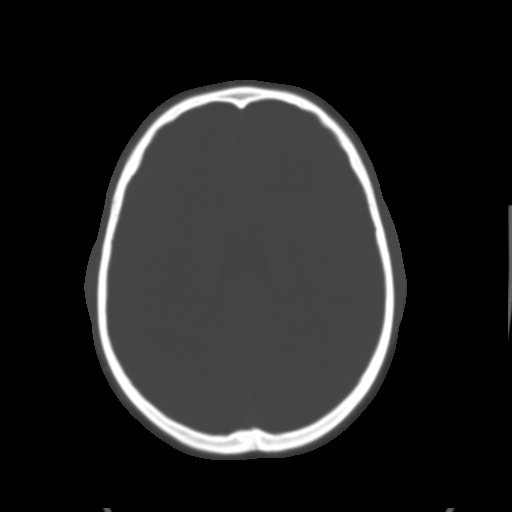
[im 25/34  brain]
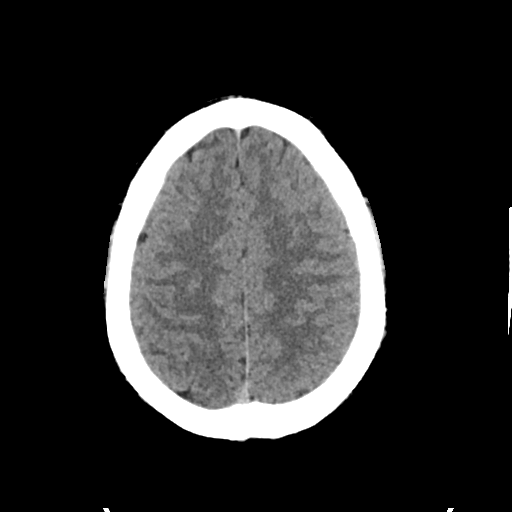
[im 29/34  brain]
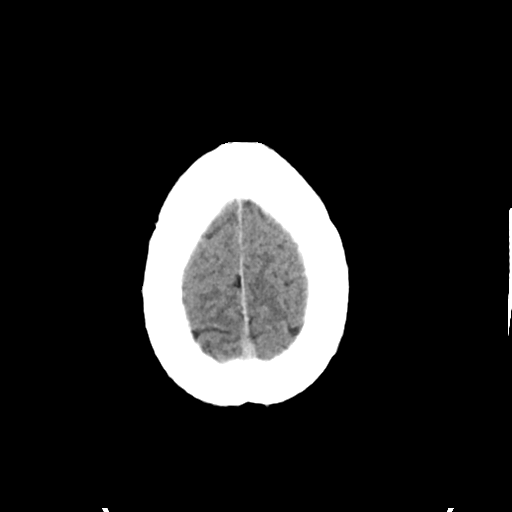

[Series 4: head bone · axial · 0.47mm/px · z∈[+1234,+1268]mm · 3 of 85 slices shown]
[im 9/85  bone]
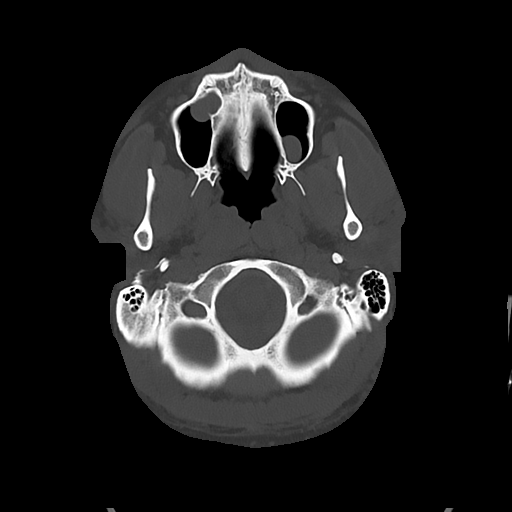
[im 17/85  bone]
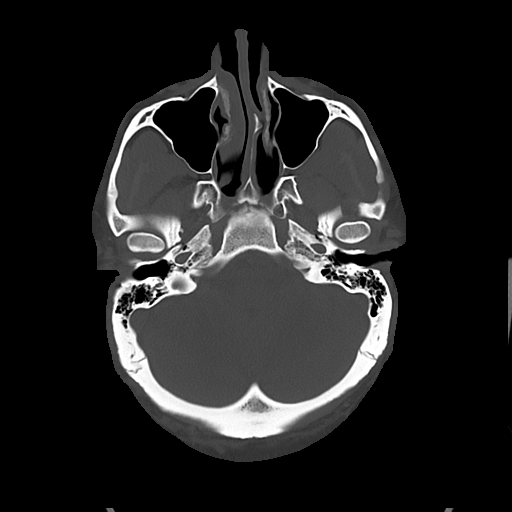
[im 26/85  bone]
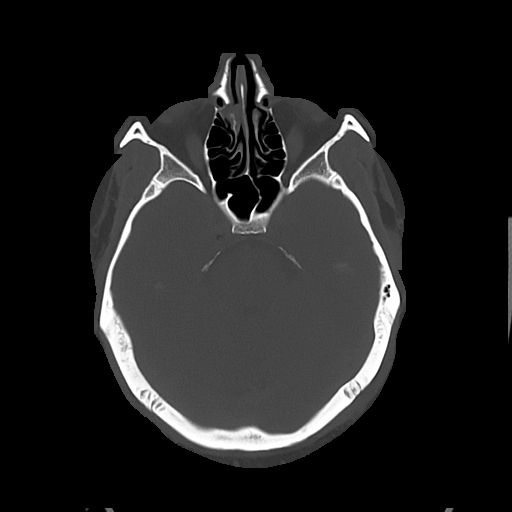

[Series 5: cor soft · coronal · 0.43mm/px · 3 of 90 slices shown]
[im 30/90  brain]
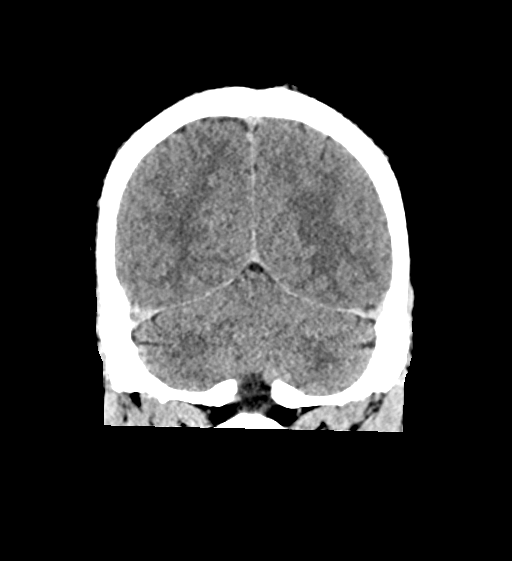
[im 40/90  brain]
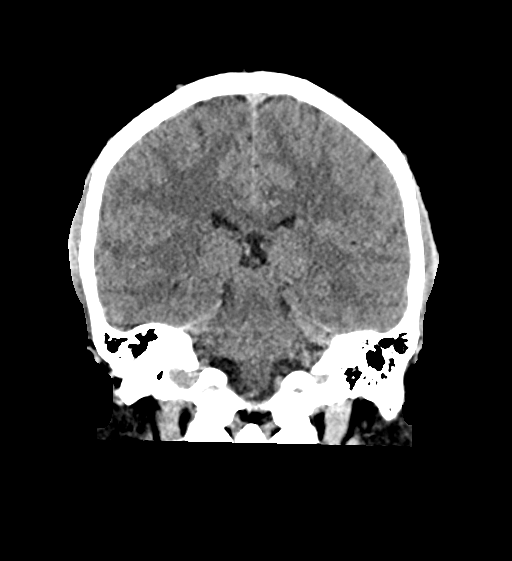
[im 50/90  brain]
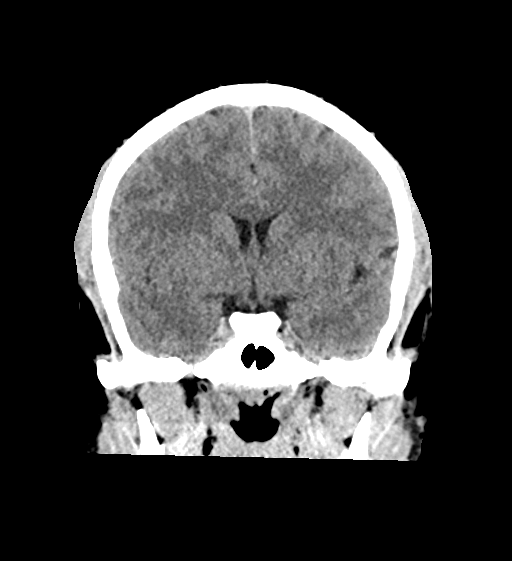

[Series 6: sag soft · sagittal · 0.37mm/px · 3 of 62 slices shown]
[im 21/62  brain]
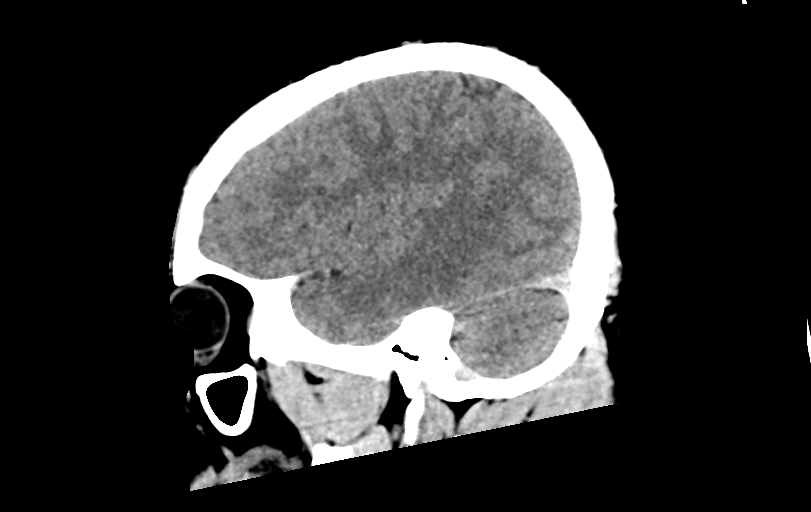
[im 31/62  brain]
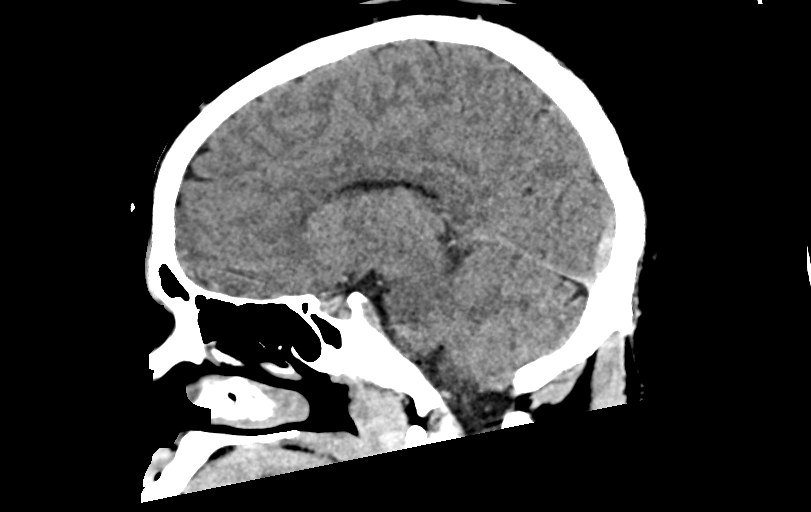
[im 41/62  brain]
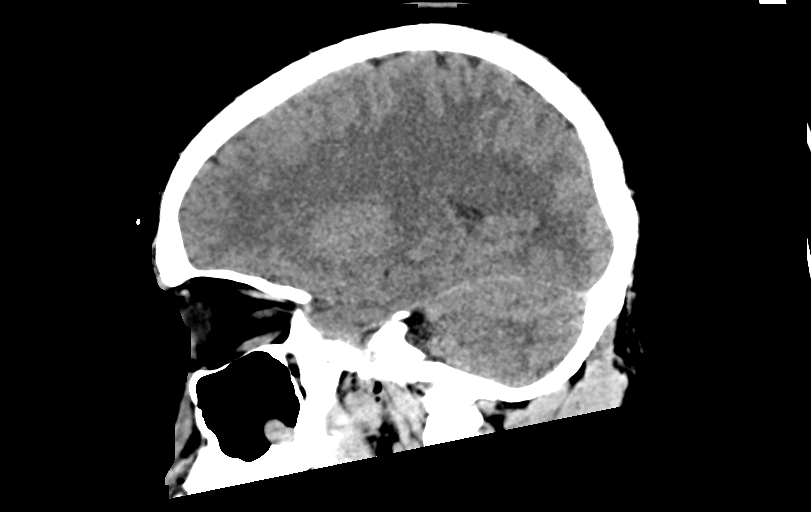

[16 of 47 positions shown; findings below may reference images not displayed]

FINDINGS: Brain: No evidence of acute infarction, hemorrhage, hydrocephalus,
extra-axial collection or mass lesion/mass effect.

Vascular: No hyperdense vessel or unexpected calcification.

Skull: Normal. Negative for fracture or focal lesion.

Sinuses/Orbits: Mucous retention cysts in the maxillary sinuses

Other: None
IMPRESSION: Negative non contrasted CT appearance of the brain.
